# Patient Record
Sex: Female | Born: 1975
Health system: Southern US, Community
[De-identification: ages and names within clinical notes are randomized; demographics above are authoritative.]

## PROBLEM LIST (undated history)

## (undated) DIAGNOSIS — E119 Type 2 diabetes mellitus without complications: Secondary | ICD-10-CM

## (undated) DIAGNOSIS — E063 Autoimmune thyroiditis: Secondary | ICD-10-CM

## (undated) DIAGNOSIS — E079 Disorder of thyroid, unspecified: Secondary | ICD-10-CM

## (undated) DIAGNOSIS — D649 Anemia, unspecified: Secondary | ICD-10-CM

## (undated) HISTORY — DX: Autoimmune thyroiditis: E06.3

## (undated) HISTORY — DX: Disorder of thyroid, unspecified: E07.9

## (undated) HISTORY — DX: Anemia, unspecified: D64.9

---

## 2012-04-24 ENCOUNTER — Emergency Department: Payer: Self-pay | Admitting: Emergency Medicine

## 2012-04-24 LAB — COMPREHENSIVE METABOLIC PANEL
Anion Gap: 8 (ref 7–16)
Bilirubin,Total: 0.5 mg/dL (ref 0.2–1.0)
Chloride: 107 mmol/L (ref 98–107)
Co2: 26 mmol/L (ref 21–32)
Creatinine: 0.59 mg/dL — ABNORMAL LOW (ref 0.60–1.30)
EGFR (African American): >60
EGFR (Non-African Amer.): >60
Osmolality: 278 (ref 275–301)
SGPT (ALT): 17 U/L (ref 12–78)
Sodium: 141 mmol/L (ref 136–145)

## 2012-04-24 LAB — URINALYSIS, COMPLETE
Blood: NEGATIVE
Nitrite: NEGATIVE
Ph: 6 (ref 4.5–8.0)
Protein: NEGATIVE
RBC,UR: 2 /HPF (ref 0–5)
Specific Gravity: 1.011 (ref 1.003–1.030)

## 2012-04-24 LAB — CBC
HCT: 36.3 % (ref 35.0–47.0)
HGB: 12.2 g/dL (ref 12.0–16.0)
MCH: 27.9 pg (ref 26.0–34.0)
MCHC: 33.8 g/dL (ref 32.0–36.0)
MCV: 83 fL (ref 80–100)
RBC: 4.39 10*6/uL (ref 3.80–5.20)

## 2012-04-24 LAB — WET PREP, GENITAL

## 2012-04-24 LAB — LIPASE, BLOOD: Lipase: 146 U/L (ref 73–393)

## 2012-04-25 ENCOUNTER — Emergency Department: Payer: Self-pay | Admitting: Emergency Medicine

## 2012-04-25 LAB — COMPREHENSIVE METABOLIC PANEL
Albumin: 3.8 g/dL (ref 3.4–5.0)
Alkaline Phosphatase: 54 U/L (ref 50–136)
Anion Gap: 8 (ref 7–16)
BUN: 8 mg/dL (ref 7–18)
Bilirubin,Total: 0.4 mg/dL (ref 0.2–1.0)
Chloride: 104 mmol/L (ref 98–107)
Creatinine: 0.74 mg/dL (ref 0.60–1.30)
EGFR (African American): >60
Glucose: 80 mg/dL (ref 65–99)
SGOT(AST): 13 U/L — ABNORMAL LOW (ref 15–37)
SGPT (ALT): 18 U/L (ref 12–78)
Sodium: 140 mmol/L (ref 136–145)
Total Protein: 7.3 g/dL (ref 6.4–8.2)

## 2012-04-25 LAB — CBC
HCT: 37.2 % (ref 35.0–47.0)
HGB: 12.3 g/dL (ref 12.0–16.0)
MCH: 27.6 pg (ref 26.0–34.0)
Platelet: 208 10*3/uL (ref 150–440)
RDW: 13.9 % (ref 11.5–14.5)

## 2012-04-25 LAB — URINE CULTURE

## 2014-05-12 DIAGNOSIS — E063 Autoimmune thyroiditis: Secondary | ICD-10-CM | POA: Insufficient documentation

## 2014-08-23 LAB — OB RESULTS CONSOLE HEPATITIS B SURFACE ANTIGEN: HEP B S AG: NEGATIVE

## 2014-08-23 LAB — OB RESULTS CONSOLE HIV ANTIBODY (ROUTINE TESTING): HIV: NONREACTIVE

## 2014-08-23 LAB — OB RESULTS CONSOLE VARICELLA ZOSTER ANTIBODY, IGG: VARICELLA IGG: NON-IMMUNE/NOT IMMUNE

## 2014-08-23 LAB — OB RESULTS CONSOLE RUBELLA ANTIBODY, IGM: RUBELLA: NON-IMMUNE/NOT IMMUNE

## 2014-10-08 NOTE — Consult Note (Signed)
PATIENT NAME:  Yesenia Moore, Yesenia Moore MR#:  [id number] DATE OF BIRTH:  1975-10-04  DATE OF CONSULTATION:  04/24/2012  REFERRING PHYSICIAN:   CONSULTING PHYSICIAN:  Aradia Estey A. Marina Gravel, MD  REASON FOR CONSULTATION: Abdominal pain and possible appendicitis.   HISTORY: This is a 39 year old [place] female accompanied by her husband, who is fluent in [place], who presents to the Emergency Room with a now 24-hour history of rather severe right lower quadrant abdominal pain associated with mild nausea. The pain started with some discomfort yesterday afternoon, became acutely worse this morning. She has had no sick contacts. Her husband stated she had a fever approximately a week to 10 days ago thought to be viral nature. Imaging in the Emergency Room demonstrates a CT scan which I personally reviewed in both axial, sagittal, and coronal views on the PACS monitor. There is a right lower quadrant process which is suspicious for appendicitis. Transvaginal ultrasound demonstrates no evidence of tubo-ovarian abscess. The patient has had a previous Cesarean section in the [place] which she describes through her husband has being rather traumatic. Surgical services were asked to consult regarding question findings on CT scan.   ALLERGIES: None.   MEDICATIONS: None.   PAST MEDICAL HISTORY: hypothyrodism.  PAST SURGICAL HISTORY: Cesarean section through a lower midline incision approximately two years ago.   SOCIAL HISTORY: The patient is married, has one child.   PHYSICAL EXAMINATION:   GENERAL: She is alert and oriented. Affect is normal.   VITAL SIGNS: Temperature 98.7, pulse 88, respiratory rate 18, blood pressure 138/87, weight 125 pounds, BMI 22.1.   LUNGS: Clear.   HEART: Regular rate and rhythm.   ABDOMEN: Abdomen demonstrates a lower midline incision with no evidence of hernia. There is tenderness to deep palpation within the right lower quadrant. Negative Rovsing's sign.   EXTREMITIES: Warm and well  perfused.   RECTAL/GENITOURINARY: Deferred, however, pelvic examination performed by the Emergency Room physician was stated as normal.   LABORATORY VALUES: Lipase 146. Urine pregnancy test is negative. BUN 6, creatinine 0.59, sodium 141, potassium 3.6, chloride 107, alkaline phosphatase 47, ALT 17, AST 20, white count 7.6, hemoglobin 12.2, hematocrit 36.3, platelet count 210,000. Urinalysis is 3+ LE, 18 WBCs per field.   Review of CT scan is as described above. In addition, there is a density seen on the body of L3, likely hemangioma. MRI is recommended.   IMPRESSION: Abdominal pain. I am suspicious for appendicitis. I discussed with the patient and her husband extensively her options including observation with re-examination and possible appendectomy. She is very reluctant to be admitted to the hospital and even have surgery. She understands the risks of continued appendicitis and perforation with infection and a more complicated course. After much discussion, the patient elected not to be admitted with follow-up in either the Emergency Room or in our office within the next 12 to 24 hours. She will discharged on oral antibiotics.  ____________________________ Jeannette How Marina Gravel, MD mab:drc D: 04/24/2012 21:19:00 ET T: 04/25/2012 09:39:06 ET JOB#: [id number]  cc: Elta Guadeloupe A. Marina Gravel, MD, <Dictator> Glennis Borger A Rudy Domek MD ELECTRONICALLY SIGNED 04/25/2012 23:29

## 2015-01-21 ENCOUNTER — Encounter: Payer: BLUE CROSS/BLUE SHIELD | Attending: Obstetrics and Gynecology | Admitting: Dietician

## 2015-01-21 ENCOUNTER — Encounter: Payer: Self-pay | Admitting: Dietician

## 2015-01-21 VITALS — BP 122/72 | Ht 65.0 in | Wt 153.2 lb

## 2015-01-21 DIAGNOSIS — O24419 Gestational diabetes mellitus in pregnancy, unspecified control: Secondary | ICD-10-CM | POA: Diagnosis present

## 2015-01-21 DIAGNOSIS — O24410 Gestational diabetes mellitus in pregnancy, diet controlled: Secondary | ICD-10-CM

## 2015-01-21 NOTE — Patient Instructions (Signed)
Read booklet on Gestational Diabetes Follow Gestational Meal Planning Guidelines Complete a 3 Day Food Record and bring to next appointment Check blood sugars 4 x day - before breakfast and 2 hrs after every meal and record  Bring blood sugar log to next appointment Purchase urine ketone strips Check urine ketones every am:  If + increase bedtime snack to 1 protein and 2 carbohydrate servings Walk 20-30 minutes at least 5 x week if permitted by MD Next appointment    01-29-15 with dietitian Call if questions arise   [phone]

## 2015-01-21 NOTE — Progress Notes (Signed)
Diabetes Self-Management Education  Visit Type: First/Initial  Appt. Start Time: 1330 Appt. End Time: 5400  01/21/2015  Ms. Yesenia Moore, identified by name and date of birth, is a 39 y.o. female with a diagnosis of Diabetes: Gestational Diabetes.  Other people present during visit:  Spouse/SO   ASSESSMENT  Blood pressure 122/72, height 5\' 5"  (1.651 m), weight 153 lb 3.2 oz (69.491 kg), last menstrual period 06/28/2014. Body mass index is 25.49 kg/(m^2).  Initial Visit Information:  Lacks knowledge of diabetes care  Does on regular exercise               Psychosocial:     Patient Belief/Attitude about Diabetes: Motivated to manage diabetes Self-care barriers: English as a second language (pt is [place] but reads and speaks some English-husband helps interpret) Self-management support: Family Other persons present: Spouse/SO Patient Concerns: Glycemic Control, Healthy Lifestyle, Nutrition/Meal planning (prevent complications) Preferred Learning Style: Auditory, Visual, Hands on Learning Readiness: Ready  Complications:   How often do you check your blood sugar?:  2-3x/day past 1-2 wks Fasting Blood glucose range (mg/dL): 91-101 Postprandial Blood glucose range (mg/dL): 104-133 Have you had a dilated eye exam in the past 12 months?: No  Diet Intake:  Breakfast:  (eats 3 meals/day) Snack (morning):  (fruit or low sugar yogurt) Snack (afternoon):  (eats occasional afternoon snack-fruit or low sugar yogurt) Dinner:  (eats fried foods 4-5x/wk) Snack (evening):  (fruit or low sugar yogurt) Beverage(s):  (drinks mostly water and occasional sweetened coffee)  Exercise:  Exercise:  (occasionally does situps and stretching )  Individualized Plan for Diabetes Self-Management Training:   Learning Objective:  Patient will have a greater understanding of diabetes self-management.  Patient education plan per assessed needs and concerns is to attend individual sessions  for     Education Topics Reviewed with Patient Today:  Definition of diabetes, type 1 and 2, and the diagnosis of diabetes, Factors that contribute to the development of diabetes, Explored patient's options for treatment of their diabetes Role of diet in the treatment of diabetes and the relationship between the three main macronutrients and blood glucose level, Food label reading, portion sizes and measuring food., Carbohydrate counting  (importance of exercise for BG control)   Purpose and frequency of SMBG., Taught/discussed recording of test results and interpretation of SMBG., Ketone testing, when, how.  (symptoms of high BG's)   Role of stress on diabetes Pregnancy and GDM  Role of pre-pregnancy blood glucose control on the development of the fetus, Role of family planning for patients with diabetes, Reviewed with patient blood glucose goals with pregnancy Lifestyle issues that need to be addressed for better diabetes care, Helped patient develop diabetes management plan for (enter comment)  PATIENTS GOALS/Plan (Developed by the patient): Improve BG's  Lead healthier lifestyle Prevent complications Become more fit     Patient Instructions  Read booklet on Gestational Diabetes Follow Gestational Meal Planning Guidelines Complete a 3 Day Food Record and bring to next appointment Check blood sugars 4 x day - before breakfast and 2 hrs after every meal and record  Bring blood sugar log to next appointment Purchase urine ketone strips Check urine ketones every am:  If + increase bedtime snack to 1 protein and 2 carbohydrate servings Walk 20-30 minutes at least 5 x week if permitted by MD Next appointment    01-29-15 with dietitian Call if questions arise   [phone]   Expected Outcomes:   Good  Education material provided: General Meal Planning  Guidelines, GEM booklet  If problems or questions, patient to contact team via:  [phone]  Future DSME appointment:  01-29-15  with RD

## 2015-01-29 ENCOUNTER — Encounter: Payer: BLUE CROSS/BLUE SHIELD | Admitting: Dietician

## 2015-01-29 VITALS — BP 100/62 | Ht 65.0 in | Wt 153.0 lb

## 2015-01-29 DIAGNOSIS — O24410 Gestational diabetes mellitus in pregnancy, diet controlled: Secondary | ICD-10-CM

## 2015-01-29 DIAGNOSIS — O24419 Gestational diabetes mellitus in pregnancy, unspecified control: Secondary | ICD-10-CM | POA: Diagnosis not present

## 2015-01-29 NOTE — Progress Notes (Signed)
   Patient's BG record indicates BGs generally within goal ranges.  Patient's verbal diet history indicates typically balanced meals; small supper likely low in protein, advised pt to add nuts to yogurt to boost protein.   Provided 1900kcal meal plan, and wrote individualized menus based on patient's food preferences.  Instructed patient on food safety, including avoidance of Listeriosis, and limiting mercury from fish.  Discussed importance of maintaining healthy lifestyle habits to reduce risk of Type 2 DM as well as Gestational DM with any future pregnancies.  Advised patient to use any remaining testing supplies to test some BGs after delivery, and to have BG tested ideally annually, as well as prior to attempting future pregnancies.

## 2015-03-04 LAB — OB RESULTS CONSOLE GC/CHLAMYDIA: Chlamydia: NEGATIVE

## 2015-03-04 LAB — OB RESULTS CONSOLE GBS: GBS: NEGATIVE

## 2015-03-26 ENCOUNTER — Encounter
Admission: RE | Admit: 2015-03-26 | Discharge: 2015-03-26 | Disposition: A | Discharge status: Home / Self Care | Payer: BLUE CROSS/BLUE SHIELD | Source: Ambulatory Visit | Attending: Obstetrics and Gynecology | Admitting: Obstetrics and Gynecology

## 2015-03-26 HISTORY — DX: Type 2 diabetes mellitus without complications: E11.9

## 2015-03-26 LAB — DIFFERENTIAL
BASOS PCT: 0 %
Basophils Absolute: 0 10*3/uL (ref 0–0.1)
EOS ABS: 0 10*3/uL (ref 0–0.7)
EOS PCT: 1 %
LYMPHS ABS: 1.6 10*3/uL (ref 1.0–3.6)
Lymphocytes Relative: 21 %
MONOS PCT: 12 %
Monocytes Absolute: 0.9 10*3/uL (ref 0.2–0.9)
NEUTROS PCT: 66 %
Neutro Abs: 4.9 10*3/uL (ref 1.4–6.5)

## 2015-03-26 LAB — CBC
HEMATOCRIT: 36.4 % (ref 35.0–47.0)
HEMOGLOBIN: 11.9 g/dL — AB (ref 12.0–16.0)
MCH: 27.4 pg (ref 26.0–34.0)
MCHC: 32.7 g/dL (ref 32.0–36.0)
MCV: 83.8 fL (ref 80.0–100.0)
Platelets: 124 10*3/uL — ABNORMAL LOW (ref 150–440)
RBC: 4.34 MIL/uL (ref 3.80–5.20)
RDW: 17.8 % — ABNORMAL HIGH (ref 11.5–14.5)
WBC: 7.7 10*3/uL (ref 3.6–11.0)

## 2015-03-26 LAB — TYPE AND SCREEN
ABO/RH(D): O POS
Antibody Screen: NEGATIVE

## 2015-03-26 LAB — RAPID HIV SCREEN (HIV 1/2 AB+AG)
HIV 1/2 Antibodies: NONREACTIVE
HIV-1 P24 Antigen - HIV24: NONREACTIVE

## 2015-03-26 LAB — ABO/RH: ABO/RH(D): O POS

## 2015-03-26 NOTE — Progress Notes (Signed)
Yesenia Moore   Your procedure is scheduled on: date 03/27/2015            Yesenia Moore   Your procedure is scheduled on: date 03/27/2015   Report to [facility] at 06:00 am.             [facility]             [place], [place] [address]  Call this number if you have problems the morning of surgery: [phone]   Remember:   Do not eat food or drink liquids after midnight.     Do not wear jewelry, make-up or nail polish.  Do not wear lotions, powders, or perfumes. You may wear deodorant.  Do not shave prior to surgery.  Do not bring valuables to the hospital.  [facility] is not responsible for any            belongings or valuables.                Contacts, dentures or bridgework may not be worn into surgery.  Leave suitcase in the car. After surgery it may be brought to your room.  For patients admitted to the hospital, discharge time is determined by your             treatment team.               Special Instructions: Preparing the skin before Cesarean Section              To help prevent the risk of infection at your surgical site, we are providing             you with rinse-free Sage 2% Chlorhexidine Gluconate (HCG) disposable             wipes.               The night before surgery:              1. Shower or bathe with warm water             2. Do not apply lotion or perfume             3. Wait one hour after shower, skin should be dry and cool             4. Open Sage wipe package - 2 disposable cloths are inside             5. Wipe the lower abdomen from the pubic line to the navel and hip bone to hip             bone with one cloth             6. Use the second cloth to wipe the front of the upper thighs             7. Allow the area to dry for one minute. DO NOT RINSE             8. Skin may feel "tacky" for several minutes             9. Dress in freshly laundered, clean clothes           10. Do not  shower the morning of surgery    Please read over the  following fact sheets that you were given: Coughing and            Deep Breathing and Surgical Site Infection Prevention    Report to [facility] at 0600 am.             [facility]             [place], [place] [address]  Call this number if you have problems the morning of surgery: [phone]   Remember:   Do not eat food or drink liquids after midnight.     Do not wear jewelry, make-up or nail polish.  Do not wear lotions, powders, or perfumes. You may wear deodorant.  Do not shave prior to surgery.  Do not bring valuables to the hospital.  [facility] is not responsible for any            belongings or valuables.                Contacts, dentures or bridgework may not be worn into surgery.  Leave suitcase in the car. After surgery it may be brought to your room.  For patients admitted to the hospital, discharge time is determined by your             treatment team.               Special Instructions: Preparing the skin before Cesarean Section              To help prevent the risk of infection at your surgical site, we are providing             you with rinse-free Sage 2% Chlorhexidine Gluconate (HCG) disposable             wipes.               The night before surgery:              1. Shower or bathe with warm water             2. Do not apply lotion or perfume             3. Wait one hour after shower, skin should be dry and cool             4. Open Sage wipe package - 2 disposable cloths are inside             5. Wipe the lower abdomen from the pubic line to the navel and hip bone to hip             bone with one cloth             6. Use the second cloth to wipe the front of the upper thighs             7. Allow the area to dry for one minute. DO NOT RINSE             8. Skin may feel "tacky" for several minutes             9. Dress in freshly laundered, clean  clothes           10. Do not shower the morning of surgery    Please read over the following fact sheets that you were given: Coughing and  Deep Breathing and Surgical Site Infection Prevention

## 2015-03-27 ENCOUNTER — Inpatient Hospital Stay: Payer: BLUE CROSS/BLUE SHIELD | Admitting: Certified Registered"

## 2015-03-27 ENCOUNTER — Encounter
Admission: RE | Disposition: A | Discharge status: Home / Self Care | Payer: Self-pay | Source: Ambulatory Visit | Attending: Obstetrics and Gynecology

## 2015-03-27 ENCOUNTER — Inpatient Hospital Stay
Admission: RE | Admit: 2015-03-27 | Discharge: 2015-03-30 | DRG: 765 | Disposition: A | Discharge status: Home / Self Care | Payer: BLUE CROSS/BLUE SHIELD | Source: Ambulatory Visit | Attending: Obstetrics and Gynecology | Admitting: Obstetrics and Gynecology

## 2015-03-27 ENCOUNTER — Encounter: Payer: Self-pay | Admitting: *Deleted

## 2015-03-27 DIAGNOSIS — Z98891 History of uterine scar from previous surgery: Secondary | ICD-10-CM

## 2015-03-27 DIAGNOSIS — O34211 Maternal care for low transverse scar from previous cesarean delivery: Principal | ICD-10-CM | POA: Diagnosis present

## 2015-03-27 DIAGNOSIS — Z3A39 39 weeks gestation of pregnancy: Secondary | ICD-10-CM | POA: Diagnosis not present

## 2015-03-27 DIAGNOSIS — O24410 Gestational diabetes mellitus in pregnancy, diet controlled: Secondary | ICD-10-CM | POA: Diagnosis present

## 2015-03-27 DIAGNOSIS — O99284 Endocrine, nutritional and metabolic diseases complicating childbirth: Secondary | ICD-10-CM | POA: Diagnosis present

## 2015-03-27 DIAGNOSIS — E039 Hypothyroidism, unspecified: Secondary | ICD-10-CM | POA: Diagnosis present

## 2015-03-27 DIAGNOSIS — D62 Acute posthemorrhagic anemia: Secondary | ICD-10-CM | POA: Diagnosis present

## 2015-03-27 LAB — RPR: RPR Ser Ql: NONREACTIVE

## 2015-03-27 LAB — GLUCOSE, CAPILLARY: Glucose-Capillary: 87 mg/dL (ref 65–99)

## 2015-03-27 SURGERY — Surgical Case
Anesthesia: Regional

## 2015-03-27 MED ORDER — OXYTOCIN 40 UNITS IN LACTATED RINGERS INFUSION - SIMPLE MED
62.5000 mL/h | INTRAVENOUS | Status: AC
  Filled 2015-03-27: qty 1000

## 2015-03-27 MED ORDER — LACTATED RINGERS IV SOLN
INTRAVENOUS | Status: DC
  Administered 2015-03-28: 03:00:00 via INTRAVENOUS

## 2015-03-27 MED ORDER — ONDANSETRON HCL 4 MG/2ML IJ SOLN
4.0000 mg | Freq: Three times a day (TID) | INTRAMUSCULAR | Status: DC | PRN
  Administered 2015-03-27: 4 mg via INTRAVENOUS
  Filled 2015-03-27: qty 2

## 2015-03-27 MED ORDER — FERROUS SULFATE 325 (65 FE) MG PO TABS
325.0000 mg | ORAL_TABLET | Freq: Two times a day (BID) | ORAL | Status: DC
  Administered 2015-03-27 – 2015-03-29 (×5): 325 mg via ORAL
  Filled 2015-03-27 (×7): qty 1

## 2015-03-27 MED ORDER — LANOLIN HYDROUS EX OINT: 1.0000 "application " | TOPICAL_OINTMENT | CUTANEOUS | Status: DC | PRN

## 2015-03-27 MED ORDER — NALBUPHINE HCL 10 MG/ML IJ SOLN
5.0000 mg | Freq: Once | INTRAMUSCULAR | Status: DC | PRN
  Filled 2015-03-27: qty 0.5

## 2015-03-27 MED ORDER — PROMETHAZINE HCL 25 MG/ML IJ SOLN: 25.0000 mg | Freq: Four times a day (QID) | INTRAMUSCULAR | Status: DC | PRN

## 2015-03-27 MED ORDER — MORPHINE SULFATE (PF) 0.5 MG/ML IJ SOLN
INTRAMUSCULAR | Status: DC | PRN
  Administered 2015-03-27: .2 mg via INTRATHECAL

## 2015-03-27 MED ORDER — IBUPROFEN 600 MG PO TABS
600.0000 mg | ORAL_TABLET | Freq: Four times a day (QID) | ORAL | Status: DC | PRN
  Administered 2015-03-28 – 2015-03-30 (×5): 600 mg via ORAL
  Filled 2015-03-27 (×6): qty 1

## 2015-03-27 MED ORDER — SENNOSIDES-DOCUSATE SODIUM 8.6-50 MG PO TABS
2.0000 | ORAL_TABLET | ORAL | Status: DC
  Administered 2015-03-29: 2 via ORAL
  Filled 2015-03-27 (×2): qty 2

## 2015-03-27 MED ORDER — OXYTOCIN 40 UNITS IN LACTATED RINGERS INFUSION - SIMPLE MED
INTRAVENOUS | Status: AC
  Filled 2015-03-27: qty 1000

## 2015-03-27 MED ORDER — SCOPOLAMINE 1 MG/3DAYS TD PT72
1.0000 | MEDICATED_PATCH | Freq: Once | TRANSDERMAL | Status: AC
  Administered 2015-03-27: 1.5 mg via TRANSDERMAL
  Filled 2015-03-27: qty 1

## 2015-03-27 MED ORDER — WITCH HAZEL-GLYCERIN EX PADS: 1.0000 "application " | MEDICATED_PAD | CUTANEOUS | Status: DC | PRN

## 2015-03-27 MED ORDER — CEFAZOLIN SODIUM-DEXTROSE 2-3 GM-% IV SOLR
2.0000 g | INTRAVENOUS | Status: AC
  Administered 2015-03-27: 2 g via INTRAVENOUS
  Filled 2015-03-27: qty 50

## 2015-03-27 MED ORDER — DIBUCAINE 1 % RE OINT: 1.0000 "application " | TOPICAL_OINTMENT | RECTAL | Status: DC | PRN

## 2015-03-27 MED ORDER — OXYTOCIN 40 UNITS IN LACTATED RINGERS INFUSION - SIMPLE MED
INTRAVENOUS | Status: DC | PRN
  Administered 2015-03-27: 1 mL via INTRAVENOUS
  Administered 2015-03-27: 799 mL via INTRAVENOUS

## 2015-03-27 MED ORDER — FENTANYL CITRATE (PF) 100 MCG/2ML IJ SOLN: 25.0000 ug | INTRAMUSCULAR | Status: DC | PRN

## 2015-03-27 MED ORDER — BUPIVACAINE HCL (PF) 0.5 % IJ SOLN
INTRAMUSCULAR | Status: AC
  Filled 2015-03-27: qty 30

## 2015-03-27 MED ORDER — LACTATED RINGERS IV SOLN
INTRAVENOUS | Status: DC
  Administered 2015-03-27 (×2): via INTRAVENOUS

## 2015-03-27 MED ORDER — ONDANSETRON HCL 4 MG/2ML IJ SOLN: 4.0000 mg | Freq: Once | INTRAMUSCULAR | Status: DC | PRN

## 2015-03-27 MED ORDER — BUPIVACAINE HCL 0.5 % IJ SOLN
INTRAMUSCULAR | Status: DC | PRN
  Administered 2015-03-27: 10 mL

## 2015-03-27 MED ORDER — HYDROMORPHONE HCL 1 MG/ML IJ SOLN: 0.2500 mg | INTRAMUSCULAR | Status: DC | PRN

## 2015-03-27 MED ORDER — BUPIVACAINE ON-Q PAIN PUMP (FOR ORDER SET NO CHG)
INJECTION | Status: DC
  Filled 2015-03-27: qty 1

## 2015-03-27 MED ORDER — BUPIVACAINE 0.25 % ON-Q PUMP DUAL CATH 400 ML
INJECTION | Status: AC
  Filled 2015-03-27: qty 400

## 2015-03-27 MED ORDER — ONDANSETRON HCL 4 MG/2ML IJ SOLN
INTRAMUSCULAR | Status: DC | PRN
  Administered 2015-03-27: 4 mg via INTRAVENOUS

## 2015-03-27 MED ORDER — CITRIC ACID-SODIUM CITRATE 334-500 MG/5ML PO SOLN
ORAL | Status: AC
  Administered 2015-03-27: 30 mL
  Filled 2015-03-27: qty 15

## 2015-03-27 MED ORDER — MEPERIDINE HCL 25 MG/ML IJ SOLN: 6.2500 mg | INTRAMUSCULAR | Status: DC | PRN

## 2015-03-27 MED ORDER — FENTANYL CITRATE (PF) 100 MCG/2ML IJ SOLN
INTRAMUSCULAR | Status: DC | PRN
  Administered 2015-03-27: 20 ug via INTRATHECAL

## 2015-03-27 MED ORDER — DIPHENHYDRAMINE HCL 25 MG PO CAPS: 25.0000 mg | ORAL_CAPSULE | ORAL | Status: DC | PRN

## 2015-03-27 MED ORDER — VARICELLA VIRUS VACCINE LIVE 1350 PFU/0.5ML IJ SUSR: 0.5000 mL | INTRAMUSCULAR | Status: DC | PRN

## 2015-03-27 MED ORDER — BUPIVACAINE HCL (PF) 0.5 % IJ SOLN: 10.0000 mL | Freq: Once | INTRAMUSCULAR | Status: DC

## 2015-03-27 MED ORDER — EPHEDRINE SULFATE 50 MG/ML IJ SOLN
INTRAMUSCULAR | Status: DC | PRN
  Administered 2015-03-27: 5 mg via INTRAVENOUS
  Administered 2015-03-27: 10 mg via INTRAVENOUS
  Administered 2015-03-27: 5 mg via INTRAVENOUS

## 2015-03-27 MED ORDER — LEVOTHYROXINE SODIUM 50 MCG PO TABS
25.0000 ug | ORAL_TABLET | Freq: Every day | ORAL | Status: DC
Start: 2015-03-28 — End: 2015-03-30
  Administered 2015-03-28 – 2015-03-30 (×3): 25 ug via ORAL
  Filled 2015-03-27 (×3): qty 1

## 2015-03-27 MED ORDER — MEASLES, MUMPS & RUBELLA VAC ~~LOC~~ INJ: 0.5000 mL | INJECTION | SUBCUTANEOUS | Status: DC | PRN

## 2015-03-27 MED ORDER — PRENATAL MULTIVITAMIN CH
1.0000 | ORAL_TABLET | Freq: Every day | ORAL | Status: DC
  Administered 2015-03-28 – 2015-03-29 (×2): 1 via ORAL
  Filled 2015-03-27 (×3): qty 1

## 2015-03-27 MED ORDER — DIPHENHYDRAMINE HCL 25 MG PO CAPS: 25.0000 mg | ORAL_CAPSULE | Freq: Four times a day (QID) | ORAL | Status: DC | PRN

## 2015-03-27 MED ORDER — DEXTROSE 5 % IV SOLN
1.0000 ug/kg/h | INTRAVENOUS | Status: DC | PRN
  Filled 2015-03-27: qty 2

## 2015-03-27 MED ORDER — LACTATED RINGERS IV SOLN
Freq: Once | INTRAVENOUS | Status: AC
  Administered 2015-03-27: 500 mL/h via INTRAVENOUS

## 2015-03-27 MED ORDER — SIMETHICONE 80 MG PO CHEW
80.0000 mg | CHEWABLE_TABLET | Freq: Three times a day (TID) | ORAL | Status: DC
  Administered 2015-03-27 – 2015-03-29 (×6): 80 mg via ORAL
  Filled 2015-03-27 (×6): qty 1

## 2015-03-27 MED ORDER — PHENYLEPHRINE HCL 10 MG/ML IJ SOLN
INTRAMUSCULAR | Status: DC | PRN
  Administered 2015-03-27 (×9): 100 ug via INTRAVENOUS

## 2015-03-27 MED ORDER — BUPIVACAINE IN DEXTROSE 0.75-8.25 % IT SOLN
INTRATHECAL | Status: DC | PRN
  Administered 2015-03-27: 1.6 mL via INTRATHECAL

## 2015-03-27 MED ORDER — NALOXONE HCL 0.4 MG/ML IJ SOLN: 0.4000 mg | INTRAMUSCULAR | Status: DC | PRN

## 2015-03-27 MED ORDER — SODIUM CHLORIDE 0.9 % IJ SOLN: 3.0000 mL | INTRAMUSCULAR | Status: DC | PRN

## 2015-03-27 MED ORDER — DIPHENHYDRAMINE HCL 50 MG/ML IJ SOLN: 12.5000 mg | INTRAMUSCULAR | Status: DC | PRN

## 2015-03-27 MED ORDER — BUPIVACAINE HCL 0.5 % IJ SOLN
5.0000 mL | Freq: Once | INTRAMUSCULAR | Status: DC
  Filled 2015-03-27: qty 5

## 2015-03-27 MED ORDER — MENTHOL 3 MG MT LOZG: 1.0000 | LOZENGE | OROMUCOSAL | Status: DC | PRN

## 2015-03-27 MED ORDER — CHLORHEXIDINE GLUCONATE CLOTH 2 % EX PADS
6.0000 | MEDICATED_PAD | Freq: Every day | CUTANEOUS | Status: DC
  Administered 2015-03-27: 2 via TOPICAL

## 2015-03-27 MED ORDER — BUPIVACAINE 0.25 % ON-Q PUMP DUAL CATH 400 ML: 400.0000 mL | INJECTION | Status: DC

## 2015-03-27 MED ORDER — NALBUPHINE HCL 10 MG/ML IJ SOLN
5.0000 mg | INTRAMUSCULAR | Status: DC | PRN
  Filled 2015-03-27: qty 0.5

## 2015-03-27 SURGICAL SUPPLY — 28 items
CANISTER SUCT 3000ML (MISCELLANEOUS) ×2 IMPLANT
CATH KIT ON-Q SILVERSOAK 5IN (CATHETERS) ×4 IMPLANT
DRSG TELFA 3X8 NADH (GAUZE/BANDAGES/DRESSINGS) ×2 IMPLANT
ELECT CAUTERY BLADE 6.4 (BLADE) ×2 IMPLANT
GAUZE SPONGE 4X4 12PLY STRL (GAUZE/BANDAGES/DRESSINGS) ×2 IMPLANT
GLOVE BIO SURGEON STRL SZ7 (GLOVE) ×16 IMPLANT
GLOVE INDICATOR 7.5 STRL GRN (GLOVE) ×2 IMPLANT
GOWN STRL REUS W/ TWL LRG LVL3 (GOWN DISPOSABLE) ×4 IMPLANT
GOWN STRL REUS W/TWL LRG LVL3 (GOWN DISPOSABLE) ×4
LIQUID BAND (GAUZE/BANDAGES/DRESSINGS) ×2 IMPLANT
NS IRRIG 1000ML POUR BTL (IV SOLUTION) ×2 IMPLANT
PACK C SECTION AR (MISCELLANEOUS) ×2 IMPLANT
PAD GROUND ADULT SPLIT (MISCELLANEOUS) ×2 IMPLANT
PAD OB MATERNITY 4.3X12.25 (PERSONAL CARE ITEMS) ×2 IMPLANT
PAD PREP 24X41 OB/GYN DISP (PERSONAL CARE ITEMS) ×2 IMPLANT
SPONGE LAP 18X18 5 PK (GAUZE/BANDAGES/DRESSINGS) ×4 IMPLANT
STRIP CLOSURE SKIN 1/2X4 (GAUZE/BANDAGES/DRESSINGS) ×2 IMPLANT
SUT CHROMIC GUT BROWN 0 54 (SUTURE) ×1 IMPLANT
SUT CHROMIC GUT BROWN 0 54IN (SUTURE) ×2
SUT MNCRL 4-0 (SUTURE) ×1
SUT MNCRL 4-0 27XMFL (SUTURE) ×1
SUT PDS AB 1 TP1 96 (SUTURE) ×2 IMPLANT
SUT PLAIN 2 0 XLH (SUTURE) ×2 IMPLANT
SUT VIC AB 0 CT1 36 (SUTURE) ×8 IMPLANT
SUT VIC AB 3-0 SH 27 (SUTURE) ×3
SUT VIC AB 3-0 SH 27X BRD (SUTURE) ×3 IMPLANT
SUTURE MNCRL 4-0 27XMF (SUTURE) ×1 IMPLANT
SWABSTK COMLB BENZOIN TINCTURE (MISCELLANEOUS) ×2 IMPLANT

## 2015-03-27 NOTE — Anesthesia Procedure Notes (Signed)
Spinal Patient location during procedure: OR Staffing Performed by: resident/CRNA  Preanesthetic Checklist Completed: patient identified, site marked, surgical consent, pre-op evaluation, timeout performed, IV checked, risks and benefits discussed and monitors and equipment checked Spinal Block Patient position: sitting Prep: Betadine Patient monitoring: heart rate, continuous pulse ox, blood pressure and cardiac monitor Approach: midline Location: L4-5 Injection technique: single-shot Needle Needle type: Whitacre and Introducer  Needle gauge: 25 G Needle length: 9 cm Assessment Events: paresthesia Additional Notes Transient left side  Paresthesia that resolved spontaneously. Negative blood return. Positive free-flowing CSF. Expiration date of kit checked and confirmed. Patient tolerated procedure well, without complications.

## 2015-03-27 NOTE — Transfer of Care (Signed)
Immediate Anesthesia Transfer of Care Note  Patient: Yesenia Moore  Procedure(s) Performed: Procedure(s): CESAREAN SECTION (N/A)  Patient Location: Mother/Baby  Anesthesia Type:Spinal  Level of Consciousness: awake  Airway & Oxygen Therapy: Patient Spontanous Breathing  Post-op Assessment: Report given to RN  Post vital signs: Reviewed  Last Vitals:  Filed Vitals:   03/27/15 1009  BP: 108/73  Pulse: 57  Temp: 36.3 C  Resp: 16    Complications: No apparent anesthesia complications

## 2015-03-27 NOTE — Op Note (Signed)
Cesarean Section Procedure Note   Yesenia Moore   03/27/2015   Pre-operative Diagnosis: 1) Interauterine pregnancy at 39 weeks, 2) PRIOR C-SECTION, desires repeat  Post-operative Diagnosis: 1) Interauterine pregnancy at 39 weeks, 2) PRIOR C-SECTION, desires repeat  Procedure: repeat low transverse cesarean section via vertical skin incision  Surgeon: Surgeon(s) and Role:    * Will Bonnet, MD - Primary    * Malachy Mood, MD - Assisting   Anesthesia: spinal   Findings:  normal appearing gravid uterus (small anterior fibroid, subserosal), fallopian tubes, and ovaries    Estimated Blood Loss: 750 mL  Total IV Fluids: 1,400 ml   Urine Output: 100 mL clear urine at end of procedure  Specimens: None  Complications: no complications  Disposition: PACU - hemodynamically stable.   Maternal Condition: stable   Baby condition / location:  Couplet care / Skin to Skin  Procedure Details:  The patient was seen in the Holding Room. The risks, benefits, complications, treatment options, and expected outcomes were discussed with the patient. The patient concurred with the proposed plan, giving informed consent. identified as Yesenia Moore and the procedure verified as C-Section Delivery. A Time Out was held and the above information confirmed.   After induction of anesthesia, the patient was draped and prepped in the usual sterile manner. A midline vertical skin incision was made and carried down through the subcutaneous tissue to the fascia. Fascial incision was made and extended in the cephalo-caudad direction. The fascia was separated from the underlying rectus tissue. The peritoneum was identified and entered. Peritoneal incision was extended longitudinally. The bladder flap was bluntly freed from the lower uterine segment. There were several filmy adhesions taken down with electrocautery.  A low transverse uterine incision was made and the hysterotomy was extended with  cranial-caudal tension. Delivered from cephalic presentation was a 3,850 gram Living newborn infant(s) with Apgar scores of 8 at one minute and 9 at five minutes. Cord ph was not sent the umbilical cord was clamped and cut cord blood was obtained for evaluation. The placenta was removed Intact and appeared normal. The uterine outline, tubes and ovaries appeared normal apart from a small (~2cm) subserosal anterior fibroid).   The uterine incision was closed with running locked sutures of 0 Vicryl.  A second layer of the same suture was thrown in an imbricating fashion.  Hemostasis was assured.  The uterus was retained to the abdomen and the paracolic gutters were cleared of all clots and debris.  The rectus muscles were inspected and found to be hemostatic.  The peritoneum was closed in a running fashion using 0 vicryl.    The On-Q catheter pumps were inserted in accordance with the manufacturer's recommendations.  The catheters were inserted approximately 3cm lateral to the incision line with care to avoid the inferior epigastric vessels.  They were inserted to a depth of the 3rd mark. They were positioned superficial to the rectus abdominus muscles and deep to the rectus fascia.    The fascia was then reapproximated with running sutures of 1-0 PDS, looped. Approximately 6-7 subcutaneous, interrupted stitches were thrown to closed dead space and decrease tension on the skin closure. The subcuticular closure was performed using 4-0 monocryl. The skin closure was reinforced using benzoin and 1/2" steri-strips.  The On-Q catheters were bolused with 5 mL of 0.5% marcaine plain for a total of 10 mL.  The catheters were affixed to the skin with surgical skin glue, steri-strips, and tegaderm.    Instrument,  sponge, and needle counts were correct prior the abdominal closure and were correct at the conclusion of the case.  The patient received Ancef 2 gram IV prior to skin incision (within 30 minutes). For VTE  prophylaxis she was wearing SCDs throughout the case.   Signed: Will Bonnet, MD, [place] 03/27/2015 10:05 AM

## 2015-03-27 NOTE — H&P (Signed)
History and Physical Interval Note:  Yesenia Moore  has presented today for surgery, with the diagnosis of PRIOR C-SECTION  The various methods of treatment have been discussed with the patient and family. After consideration of risks, benefits and other options for treatment, the patient has consented to  Procedure(s): CESAREAN SECTION (N/A) as a surgical intervention .  The patient's history has been reviewed, patient examined, no change in status, stable for surgery.  I have reviewed the patient's chart and labs.  Questions were answered to the patient's satisfaction.    The patient does not take a beta blocker and one is not indicated for this surgery.   Will Bonnet, MD 03/27/2015 8:03 AM

## 2015-03-27 NOTE — Progress Notes (Signed)
Pt. Ready for transfer to OR for repeat C/S.  Spouse at the bedside, pt. Chooses spouse to interpret for her, pt. Understands English and speaks english but request her husband to translate -if needed. Dr. Glennon Mac and Dr. Andree Elk to the bedside to greet pt., and answer any questions pt. May have pertaining to the C/S.  Pt. Verbalized understanding of procedure, spouse asked questions, questions answered.  RN assist pt. To OR (ambulate) for C/S. OR staff ready and waiting in OR.

## 2015-03-27 NOTE — Anesthesia Preprocedure Evaluation (Addendum)
Anesthesia Evaluation  Patient identified by MRN, date of birth, ID band Patient awake    Reviewed: Allergy & Precautions, H&P , NPO status , Patient's Chart, lab work & pertinent test results, reviewed documented beta blocker date and time   Airway Mallampati: III  TM Distance: >3 FB Neck ROM: full    Dental no notable dental hx. (+) Teeth Intact   Pulmonary neg pulmonary ROS,    Pulmonary exam normal breath sounds clear to auscultation       Cardiovascular Exercise Tolerance: Good negative cardio ROS Normal cardiovascular exam Rhythm:regular Rate:Normal     Neuro/Psych negative neurological ROS  negative psych ROS   GI/Hepatic negative GI ROS, Neg liver ROS,   Endo/Other  negative endocrine ROSdiabetesHypothyroidism Hx of hashimotos thyroiditis.  Renal/GU negative Renal ROS  negative genitourinary   Musculoskeletal   Abdominal   Peds  Hematology negative hematology ROS (+) anemia ,   Anesthesia Other Findings Hx of tachycardia after a dental procedure in the [place].  Pt is unaware of any anaphylactic sequelae and had elevated heart rate when given ?"octrataine"...per orders husbands recollection.  No known problems with lidocaine or bupivicaine.  I have gone over the risks and benefits of spinal vs. General anesthesia and based on our thourough discussion, they prefer spinal.  JA  Reproductive/Obstetrics (+) Pregnancy                            Anesthesia Physical Anesthesia Plan  ASA: II  Anesthesia Plan: Regional and Spinal   Post-op Pain Management:    Induction:   Airway Management Planned:   Additional Equipment:   Intra-op Plan:   Post-operative Plan:   Informed Consent: I have reviewed the patients History and Physical, chart, labs and discussed the procedure including the risks, benefits and alternatives for the proposed anesthesia with the patient or authorized  representative who has indicated his/her understanding and acceptance.     Plan Discussed with: CRNA  Anesthesia Plan Comments:         Anesthesia Quick Evaluation

## 2015-03-27 NOTE — Progress Notes (Signed)
Pericare given, clean pad applied, pt. Transferred to [place] [facility]. Spouse in the nursery with infant.  Pain 0/10 with OnQ in place, foley with 0 ml (150 ml emptied). Pt. Stable.

## 2015-03-27 NOTE — Discharge Summary (Signed)
Obstetrical Discharge Summary  Date of Admission: 03/27/2015 Date of Discharge: 03/30/2015  Primary OB: [facility]  Gestational Age at Delivery: [redacted]w[redacted]d  Antepartum complications: 1) Hypothyroidism, 2) history of prior cesarean delivery with unknown uterine incision type, 3) gestational diabetes, diet controlled Reason for Admission: Scheduled repeat cesarean delivery Date of Delivery:  03/27/2015  Delivered By: Prentice Docker, MD Delivery Type: repeat cesarean section, low transverse incision Intrapartum complications/course: None Anesthesia: Spinal Placenta: sponatneous Laceration: n/a Episiotomy: none Newborn Data: Live born female  Birth Weight: 8 lb 7.8 oz (3850 g) APGAR: 8, 9  Discharge Physical Exam:  BP 112/65 mmHg  Pulse 67  Temp(Src) 98.5 F (36.9 C) (Oral)  Resp 18  Ht 5\' 5"  (1.651 m)  Wt 70.308 kg (155 lb)  BMI 25.79 kg/m2  SpO2 100%  Breastfeeding? Unknown  General: NAD CV: RRR Pulm: CTABL, nl effort ABD: s/nd/nt, fundus firm and below the umbilicus Lochia: moderate Incision: c/d/i DVT Evaluation: LE non-ttp, no evidence of DVT on exam.  HEMOGLOBIN  Date Value Ref Range Status  03/28/2015 10.0* 12.0 - 16.0 g/dL Final   HGB  Date Value Ref Range Status  04/25/2012 12.3 12.0-16.0 g/dL Final   HCT  Date Value Ref Range Status  03/28/2015 29.5* 35.0 - 47.0 % Final  04/25/2012 37.2 35.0-47.0 % Final    Post partum course: Uncomplicated postpartum course Postpartum Procedures: none Disposition: stable, discharge to home.  Rh Immune globulin given: n/a Rubella vaccine given: yes Tdap vaccine given in AP or PP setting: yes on 02/18/15 Flu vaccine given in AP or PP setting: no  Contraception: TBD  Prenatal Labs: O+ / RNI / VZNI / HBsAg neg / RPR NR / HIV neg / Pap HPV pos, no dysplasia     Plan:  Yesenia Moore was discharged to home in good condition. Follow-up appointment at [facility] with Dr Glennon Mac in 1 week for incision check. She  will also need a 2 hour glucose tolerance test scheduled for her six week visit.   Discharge Medications:   Medication List    TAKE these medications        ferrous sulfate 325 (65 FE) MG tablet  Take 1 tablet (325 mg total) by mouth daily.     ibuprofen 600 MG tablet  Commonly known as:  ADVIL,MOTRIN  Take 1 tablet (600 mg total) by mouth every 6 (six) hours as needed for mild pain.     oxyCODONE-acetaminophen 5-325 MG tablet  Commonly known as:  PERCOCET/ROXICET  Take 1-2 tablets by mouth every 3 (three) hours as needed for moderate pain.     PRENATAL VITAMINS PO  Take 1 tablet by mouth daily.     SYNTHROID 25 MCG tablet  Generic drug:  levothyroxine  Take 1 tablet by mouth daily.        Signed: Malachy Mood, MD

## 2015-03-28 LAB — CBC
HCT: 29.5 % — ABNORMAL LOW (ref 35.0–47.0)
HEMOGLOBIN: 10 g/dL — AB (ref 12.0–16.0)
MCH: 28.1 pg (ref 26.0–34.0)
MCHC: 33.8 g/dL (ref 32.0–36.0)
MCV: 83.2 fL (ref 80.0–100.0)
PLATELETS: 116 10*3/uL — AB (ref 150–440)
RBC: 3.55 MIL/uL — AB (ref 3.80–5.20)
RDW: 18.1 % — ABNORMAL HIGH (ref 11.5–14.5)
WBC: 10.5 10*3/uL (ref 3.6–11.0)

## 2015-03-28 MED ORDER — MORPHINE SULFATE (PF) 2 MG/ML IV SOLN: 1.0000 mg | INTRAVENOUS | Status: DC | PRN

## 2015-03-28 MED ORDER — OXYCODONE-ACETAMINOPHEN 5-325 MG PO TABS
1.0000 | ORAL_TABLET | ORAL | Status: DC | PRN
  Filled 2015-03-28: qty 2

## 2015-03-28 NOTE — Anesthesia Post-op Follow-up Note (Signed)
  Anesthesia Pain Follow-up Note  Patient: Yesenia Moore  Day #: 1  Date of Follow-up: 03/28/2015 Time: 7:08 AM  Last Vitals:  Filed Vitals:   03/28/15 0255  BP: 97/56  Pulse: 71  Temp: 36.6 C  Resp: 16    Level of Consciousness: alert  Pain: none   Side Effects:None  Catheter Site Exam:clean, dry, no drainage  Plan: D/C from anesthesia care  COOK-MARTIN,CINDY

## 2015-03-28 NOTE — Progress Notes (Signed)
Admit Date: 03/27/2015 Today's Date: 03/28/2015  Subjective: Postpartum Day 1: Cesarean Delivery Patient reports tolerating PO and no problems voiding.    Objective: Vital signs in last 24 hours: Temp:  [97.5 F (36.4 C)-98.9 F (37.2 C)] 98.5 F (36.9 C) (10/07 0801) Pulse Rate:  [59-78] 69 (10/07 0801) Resp:  [16-20] 18 (10/07 0801) BP: (97-130)/(56-85) 108/66 mmHg (10/07 0801) SpO2:  [98 %-100 %] 98 % (10/07 0255)  Physical Exam:  General: alert and cooperative Lochia: appropriate Uterine Fundus: firm Incision: dressing dry; OnQ in place DVT Evaluation: No evidence of DVT seen on physical exam.   Recent Labs  03/26/15 1048 03/28/15 0503  HGB 11.9* 10.0*  HCT 36.4 29.5*    Assessment/Plan: Status post Cesarean section. Doing well postoperatively.  Continue current care.   Diet, Amb, Shower, Change dressing Plans condoms Breast feeding  Oluwaseyi Raffel PAUL 03/28/2015, 10:35 AM

## 2015-03-28 NOTE — Anesthesia Postprocedure Evaluation (Signed)
  Anesthesia Post-op Note  Patient: Yesenia Moore  Procedure(s) Performed: Procedure(s): CESAREAN SECTION (N/A)  Anesthesia type:Regional, Spinal  Patient location: PACU  Post pain: Pain level controlled  Post assessment: Post-op Vital signs reviewed, Patient's Cardiovascular Status Stable, Respiratory Function Stable, Patent Airway and No signs of Nausea or vomiting  Post vital signs: Reviewed and stable  Last Vitals:  Filed Vitals:   03/28/15 1142  BP: 108/58  Pulse: 78  Temp: 37.1 C  Resp: 20    Level of consciousness: awake, alert  and patient cooperative  Complications: No apparent anesthesia complications

## 2015-03-29 MED ORDER — BISACODYL 10 MG RE SUPP
10.0000 mg | Freq: Once | RECTAL | Status: AC
  Administered 2015-03-29: 10 mg via RECTAL
  Filled 2015-03-29: qty 1

## 2015-03-29 NOTE — Progress Notes (Signed)
  Subjective:  Doing well no concerns, pain adequately controlled.  Complains of gas, passing flatus.  Minimal lcohai   Objective:  Blood pressure 108/70, pulse 86, temperature 98.7 F (37.1 C), temperature source Oral, resp. rate 18, height $RemoveBe'5\' 5"'QrKvwWbLV$  (1.651 m), weight 70.308 kg (155 lb), SpO2 98 %, unknown if currently breastfeeding.  General: NAD Pulmonary: no increased work of breathing Abdomen: non-distended, non-tender, fundus firm at level of umbilicus Incision: D/C/I Extremities: no edema, no erythema, no tenderness  Results for orders placed or performed during the hospital encounter of 03/27/15 (from the past 72 hour(s))  Glucose, capillary     Status: None   Collection Time: 03/27/15  7:15 AM  Result Value Ref Range   Glucose-Capillary 87 65 - 99 mg/dL  CBC     Status: Abnormal   Collection Time: 03/28/15  5:03 AM  Result Value Ref Range   WBC 10.5 3.6 - 11.0 K/uL   RBC 3.55 (L) 3.80 - 5.20 MIL/uL   Hemoglobin 10.0 (L) 12.0 - 16.0 g/dL   HCT 29.5 (L) 35.0 - 47.0 %   MCV 83.2 80.0 - 100.0 fL   MCH 28.1 26.0 - 34.0 pg   MCHC 33.8 32.0 - 36.0 g/dL   RDW 18.1 (H) 11.5 - 14.5 %   Platelets 116 (L) 150 - 440 K/uL     Assessment:   39 y.o. G2P2001 postoperativeday #2 RLTCS via midline vertical    Plan:  1) Acute blood loss anemia - hemodynamically stable and asymptomatic - po ferrous sulfate  2) O pos / RNI / VZNI - varivax and MMR postpartum  3) TDAP up to date / influenza on discharge  4) Breast/undecided  5) Disposition - home POD3

## 2015-03-30 MED ORDER — INFLUENZA VAC SPLIT QUAD 0.5 ML IM SUSY
0.5000 mL | PREFILLED_SYRINGE | INTRAMUSCULAR | Status: AC
  Administered 2015-03-30: 0.5 mL via INTRAMUSCULAR
  Filled 2015-03-30: qty 0.5

## 2015-03-30 MED ORDER — FERROUS SULFATE 325 (65 FE) MG PO TABS: 325.0000 mg | ORAL_TABLET | Freq: Every day | ORAL | Status: DC

## 2015-03-30 MED ORDER — OXYCODONE-ACETAMINOPHEN 5-325 MG PO TABS: 1.0000 | ORAL_TABLET | ORAL | Status: DC | PRN

## 2015-03-30 MED ORDER — IBUPROFEN 600 MG PO TABS: 600.0000 mg | ORAL_TABLET | Freq: Four times a day (QID) | ORAL | Status: DC | PRN

## 2015-03-30 NOTE — Progress Notes (Signed)
Educated patient on the need to receive her MMR and Varicella vaccine. Patient and husband inquired about getting vaccines at a later date because mom didn't want to receive 3 shots in one day (flu, mmr, varicella). Nurse encouraged patient to get all three before leaving the hospital however patient decided to only get the flu vaccine. Patient only wants flu vaccine because other daughter is in school and they are afraid she may bring the flu home, the nurse also educated patient on the need to protect against measles, mumps, rubella and chicken pox, but patient insisted on only getting the flu.

## 2015-04-02 NOTE — Addendum Note (Signed)
Addendum  created 04/02/15 1233 by Molli Barrows, MD   Modules edited: Anesthesia Events, Narrator   Narrator:  Narrator: Event Log Edited

## 2015-04-10 NOTE — Addendum Note (Signed)
Addendum  created 04/10/15 0855 by Doreen Salvage, CRNA   Modules edited: Notes Section   Notes Section:  File: [id number]

## 2016-03-26 ENCOUNTER — Encounter: Payer: Self-pay | Admitting: Emergency Medicine

## 2016-03-26 ENCOUNTER — Emergency Department
Admission: EM | Admit: 2016-03-26 | Discharge: 2016-03-26 | Disposition: A | Discharge status: Home / Self Care | Payer: BLUE CROSS/BLUE SHIELD | Attending: Emergency Medicine | Admitting: Emergency Medicine

## 2016-03-26 DIAGNOSIS — E119 Type 2 diabetes mellitus without complications: Secondary | ICD-10-CM | POA: Insufficient documentation

## 2016-03-26 DIAGNOSIS — R42 Dizziness and giddiness: Secondary | ICD-10-CM | POA: Insufficient documentation

## 2016-03-26 LAB — BASIC METABOLIC PANEL
Anion gap: 6 (ref 5–15)
BUN: 12 mg/dL (ref 6–20)
CHLORIDE: 106 mmol/L (ref 101–111)
CO2: 25 mmol/L (ref 22–32)
CREATININE: 0.52 mg/dL (ref 0.44–1.00)
Calcium: 9.2 mg/dL (ref 8.9–10.3)
GFR calc non Af Amer: >60 mL/min (ref >60)
Glucose, Bld: 117 mg/dL — ABNORMAL HIGH (ref 65–99)
POTASSIUM: 3.9 mmol/L (ref 3.5–5.1)
SODIUM: 137 mmol/L (ref 135–145)

## 2016-03-26 LAB — CBC WITH DIFFERENTIAL/PLATELET
BASOS ABS: 0.1 10*3/uL (ref 0–0.1)
BASOS PCT: 0 %
EOS ABS: 0 10*3/uL (ref 0–0.7)
EOS PCT: 0 %
HCT: 39.4 % (ref 35.0–47.0)
HEMOGLOBIN: 13.3 g/dL (ref 12.0–16.0)
LYMPHS ABS: 1 10*3/uL (ref 1.0–3.6)
Lymphocytes Relative: 8 %
MCH: 28.4 pg (ref 26.0–34.0)
MCHC: 33.8 g/dL (ref 32.0–36.0)
MCV: 84.2 fL (ref 80.0–100.0)
Monocytes Absolute: 0.7 10*3/uL (ref 0.2–0.9)
Monocytes Relative: 6 %
NEUTROS PCT: 86 %
Neutro Abs: 10.7 10*3/uL — ABNORMAL HIGH (ref 1.4–6.5)
PLATELETS: 180 10*3/uL (ref 150–440)
RBC: 4.67 MIL/uL (ref 3.80–5.20)
RDW: 14.2 % (ref 11.5–14.5)
WBC: 12.5 10*3/uL — AB (ref 3.6–11.0)

## 2016-03-26 LAB — TSH: TSH: 0.892 u[IU]/mL (ref 0.350–4.500)

## 2016-03-26 MED ORDER — ONDANSETRON HCL 4 MG PO TABS: 4.0000 mg | ORAL_TABLET | Freq: Three times a day (TID) | ORAL | 1 refills | Status: DC | PRN

## 2016-03-26 MED ORDER — SODIUM CHLORIDE 0.9 % IV BOLUS (SEPSIS)
1000.0000 mL | Freq: Once | INTRAVENOUS | Status: AC
  Administered 2016-03-26: 1000 mL via INTRAVENOUS

## 2016-03-26 NOTE — Discharge Instructions (Addendum)
You examine evaluation are reassuring today in the emergency department, and as we discussed I suspect you have vertigo.  Typically vertigo will be better in about one week.  You may want to try over the counter ibuprofen 600mg  every 8 hours as needed for body/muscle aches, and meclizine (also called antivert) as directed on label.  Because of breastfeeding, we discussed go ahead and dump breast milk after these medications.  Return to the emergency department for any worsening condition including any one-sided weakness, numbness, confusion or altered mental status, seizure, headache, fever, or any other symptoms concerning to you.

## 2016-03-26 NOTE — ED Provider Notes (Signed)
[facility] Emergency Department Provider Note ____________________________________________   I have reviewed the triage vital signs and the triage nursing note.  HISTORY  Chief Complaint Emesis and Dizziness   Historian Patient and husband, choose to speak English, decline interpreter  HPI Yesenia Moore is a 40 y.o. female states that she had upper respiratory congestion including sore throat and fever Wednesday and Thursday and yesterday evening started to feel a little bit better. This morning she woke up overnight to breast-feed her son and felt room spinning dizziness as well as nausea and near vomiting when she tried to stand up and walk around. She states that she feels off balance. No specific weakness or numbness. No confusion or altered mental status. No history of migraines. No history of known vertigo.  Dizziness and room spinning happens when she moves her head side-to-side or changes positions.  History of gestational diabetes for which she had followed with Dr. Gabriel Carina, endocrinology.  Goes to the clinic at [facility] where her husband works.  Reports that about a month ago when she was in the [place] she had an accident followed by an MRI to evaluate for possible injury or concussion. They state that she had been told that there is a cyst that is going to be checked in about a year.     Past Medical History:  Diagnosis Date  . Anemia   . Diabetes mellitus without complication (HCC)    Gestational diabetes  . Thyroid disease     Patient Active Problem List   Diagnosis Date Noted  . S/P cesarean section 03/27/2015  . 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 CESAREAN SECTION    . CESAREAN SECTION N/A 03/27/2015   Procedure: CESAREAN SECTION;  Surgeon: Will Bonnet, MD;  Location: [facility] ORS;  Service: Obstetrics;  Laterality: N/A;    Prior to Admission medications    Medication Sig Start Date End Date Taking? Authorizing Provider  levothyroxine (SYNTHROID, LEVOTHROID) 75 MCG tablet Take 75 mcg by mouth daily before breakfast.   Yes Historical Provider, MD  levothyroxine (SYNTHROID) 25 MCG tablet Take 1 tablet by mouth daily. 07/16/14   Historical Provider, MD    Allergies  Allergen Reactions  . Lidocaine Other (See Comments)    "fast heart beat, dizziness"    History reviewed. No pertinent family history.  Social History Social History  Substance Use Topics  . Smoking status: Never Smoker  . Smokeless tobacco: Never Used  . Alcohol use No    Review of Systems  Constitutional: Negative for fever Since yesterday. Eyes: Negative for visual changes. ENT: Sore throat now resolved. Cardiovascular: Negative for chest pain. Respiratory: Negative for shortness of breath. Gastrointestinal: Negative for abdominal pain or diarrhea. She did have some nausea. Genitourinary:  Musculoskeletal: Negative for back pain. Skin: Negative for rash. Neurological: Negative for headache.  Dizziness described as room spinning as per history of present illness. 10 point Review of Systems otherwise negative ____________________________________________   PHYSICAL EXAM:  VITAL SIGNS: ED Triage Vitals  Enc Vitals Group     BP 03/26/16 0656 120/85     Pulse Rate 03/26/16 0656 85     Resp 03/26/16 0656 10     Temp 03/26/16 0656 97.9 F (36.6 C)     Temp Source 03/26/16 0656 Oral     SpO2 03/26/16 0656 97 %     Weight 03/26/16 0649 141 lb (64 kg)  Height 03/26/16 0649 5\' 5"  (1.651 m)     Head Circumference --      Peak Flow --      Pain Score 03/26/16 0650 0     Pain Loc --      Pain Edu? --      Excl. in [place]? --      Constitutional: Alert and oriented. Well appearing and in no distress. HEENT   Head: Normocephalic and atraumatic.      Eyes: Conjunctivae are normal. PERRL. Normal extraocular movements.      Ears:   TMs normal bilaterally.       Nose: No congestion/rhinnorhea.   Mouth/Throat: Mucous membranes are moist.   Neck: No stridor.  No neck stiffness. Cardiovascular/Chest: Normal rate, regular rhythm.  No murmurs, rubs, or gallops. Respiratory: Normal respiratory effort without tachypnea nor retractions. Breath sounds are clear and equal bilaterally. No wheezes/rales/rhonchi. Gastrointestinal: Soft. No distention, no guarding, no rebound. Nontender.    Genitourinary/rectal:Deferred Musculoskeletal: Nontender with normal range of motion in all extremities. No joint effusions.  No lower extremity tenderness.  No edema. Neurologic:  No facial droop. Normal speech and language. No gross or focal neurologic deficits are appreciated.  5/5 strength in 4 extremities. No sensory deficits. Finger-nose intact bilaterally. No pronator drift. Skin:  Skin is warm, dry and intact. No rash noted. Psychiatric: Mood and affect are normal. Speech and behavior are normal. Patient exhibits appropriate insight and judgment.   ____________________________________________  LABS (pertinent positives/negatives)  Labs Reviewed  BASIC METABOLIC PANEL - Abnormal; Notable for the following:       Result Value   Glucose, Bld 117 (*)    All other components within normal limits  CBC WITH DIFFERENTIAL/PLATELET - Abnormal; Notable for the following:    WBC 12.5 (*)    Neutro Abs 10.7 (*)    All other components within normal limits  TSH    ____________________________________________    EKG I, Lisa Roca, MD, the attending physician have personally viewed and interpreted all ECGs.  80 bpm. Normal sinus rhythm. Narrow QRS. Normal axis. Normal ST and T-wave ____________________________________________  RADIOLOGY All Xrays were viewed by me. Imaging interpreted by Radiologist.  None __________________________________________  PROCEDURES  Procedure(s) performed: None  Critical Care performed:  None  ____________________________________________   ED COURSE / ASSESSMENT AND PLAN  Pertinent labs & imaging results that were available during my care of the patient were reviewed by me and considered in my medical decision making (see chart for details).   Ms. Boutros is here for dizziness further described as room spinning with nausea. I think her symptoms sound like peripheral vertigo. She recently had a upper respiratory infection, but most of those symptoms are resolved per her. On exam she has no focal neurologic deficit, and I am less suspicious for an intracranial emergency. She did have an MRI reportedly within the past month. Given the onset at night, I was questioning the possibility for tumor, but again highly unlikely especially in the setting of recent brain imaging within 1 month.  No significant headache, I don't think this seems like subarachnoid hemorrhage or other intracranial hemorrhage or stroke. I discussed with the patient and her husband that at this point I do not recommend head imaging in terms of risk versus benefit for CT radiation.  She is breast-feeding and so I chose to hold off on first-generation antihistamine including Benadryl and meclizine which are modified otherwise use to help with symptoms of vertigo. I did go  ahead and give her a liter of fluid and Zofran.  On reevaluation she feels somewhat better, she is complaining that she does have a mild headache. Overall she is very well-appearing. She was questioning a diagnosis of meningitis, she hasn't been on recent antibiotics, and does not clinically appear to be concerning for bacterial meningitis. We discussed additional testing for this would be lumbar puncture, and I did not recommend that at this point time.  She does seem pretty anxious, about fatigue and her thyroid level, and her vitamin D level. I have asked that she follow up with both her endocrinologist and [facility]. The  only abnormality on her blood work was the slightly elevated at 117.  In any case, I think that she is okay for discharge. I have recommended that if she is having symptoms she might try ibuprofen and meclizine over-the-counter, and out of concern about side effects and sedation with breast feeding, asked her to go ahead and pump and dump. The child feeds only once or twice per day on breast-feeding, and typically eats mostly food throughout the day.    CONSULTATIONS:  None   Patient / Family / Caregiver informed of clinical course, medical decision-making process, and agree with plan.   I discussed return precautions, follow-up instructions, and discharge instructions with patient and/or family.   ___________________________________________   FINAL CLINICAL IMPRESSION(S) / ED DIAGNOSES   Final diagnoses:  Vertigo              Note: This dictation was prepared with Dragon dictation. Any transcriptional errors that result from this process are unintentional    Lisa Roca, MD 03/26/16 1127

## 2016-03-26 NOTE — ED Notes (Signed)
Pt complaining of dizziness when moving head and getting up. C/o of n/v x 2 since 1am.  Pain in left chest with deep breaths.

## 2016-03-26 NOTE — ED Notes (Signed)
AAOx3.  Skin warm and dry.  NAD 

## 2016-03-26 NOTE — ED Triage Notes (Signed)
Pt arrived to the ED accompanied by her husband for complaints of nausea, vomiting and dizziness. Pt reports that she started to feel bad last night and today is worse. Pt is AOx4 in mild distress.

## 2016-09-15 DIAGNOSIS — R5381 Other malaise: Secondary | ICD-10-CM | POA: Diagnosis not present

## 2016-09-15 DIAGNOSIS — E039 Hypothyroidism, unspecified: Secondary | ICD-10-CM | POA: Diagnosis not present

## 2016-09-16 DIAGNOSIS — E039 Hypothyroidism, unspecified: Secondary | ICD-10-CM | POA: Diagnosis not present

## 2016-09-16 DIAGNOSIS — R5381 Other malaise: Secondary | ICD-10-CM | POA: Diagnosis not present

## 2016-10-18 ENCOUNTER — Encounter: Payer: Self-pay | Admitting: Obstetrics and Gynecology

## 2016-10-18 ENCOUNTER — Ambulatory Visit (INDEPENDENT_AMBULATORY_CARE_PROVIDER_SITE_OTHER): Payer: BLUE CROSS/BLUE SHIELD | Admitting: Obstetrics and Gynecology

## 2016-10-18 VITALS — BP 110/80 | HR 83 | Ht 65.0 in | Wt 136.0 lb

## 2016-10-18 DIAGNOSIS — Z124 Encounter for screening for malignant neoplasm of cervix: Secondary | ICD-10-CM

## 2016-10-18 DIAGNOSIS — Z0000 Encounter for general adult medical examination without abnormal findings: Secondary | ICD-10-CM

## 2016-10-18 DIAGNOSIS — Z131 Encounter for screening for diabetes mellitus: Secondary | ICD-10-CM

## 2016-10-18 DIAGNOSIS — Z01419 Encounter for gynecological examination (general) (routine) without abnormal findings: Secondary | ICD-10-CM | POA: Diagnosis not present

## 2016-10-18 NOTE — Progress Notes (Signed)
Gynecology Annual Exam  PCP: Pcp Not In System  Chief Complaint  Patient presents with  . Gynecologic Exam    History of Present Illness:  Yesenia Moore is a 41 y.o. G2P2002 who LMP was Patient's last menstrual period was 10/01/2016 (exact date)., presents today for her annual examination.  Her menses are regular every 28-30 days, lasting 3 day(s).  Dysmenorrhea none. She does not have intermenstrual bleeding.  She is single partner, contraception - condoms always.  Last Pap: August 18, 2015  Results were: no abnormalities /neg HPV DNA negative Hx of STDs: none  Last mammogram: n/a There is no FH of breast cancer. There is no FH of ovarian cancer. The patient does not do self-breast exams.  Tobacco use: The patient denies current or previous tobacco use. Alcohol use: none Exercise: moderately active  She does get adequate calcium and Vitamin D in her diet.  Reports that she would like to have a check for diabetes due to having GDM with her prior pregnancy.   The patient wears seatbelts: yes.     Review of Systems: Review of Systems  Constitutional: Negative.   HENT: Negative.   Eyes: Negative.   Respiratory: Negative.   Cardiovascular: Negative.   Gastrointestinal: Negative.   Genitourinary: Negative.   Musculoskeletal: Negative.   Skin: Negative.   Neurological: Negative.   Psychiatric/Behavioral: Negative.     Past Medical History:  Diagnosis Date  . Anemia   . Diabetes mellitus without complication (HCC)    Gestational diabetes  . Hashimoto's thyroiditis   . Thyroid disease     Past Surgical History:  Procedure Laterality Date  . CESAREAN SECTION    . CESAREAN SECTION N/A 03/27/2015   Procedure: CESAREAN SECTION;  Surgeon: Will Bonnet, MD;  Location: [facility] ORS;  Service: Obstetrics;  Laterality: N/A;    Medications:   Medication Sig Start Date End Date Taking? Authorizing Provider  levothyroxine (SYNTHROID, LEVOTHROID) 75 MCG tablet Take by  mouth. 09/20/16  Yes Historical Provider, MD    Allergies  Allergen Reactions  . Lidocaine Other (See Comments)    "fast heart beat, dizziness"    Gynecologic History: Patient's last menstrual period was 10/01/2016 (exact date).  Obstetric History: [id number], s/p c-section x 2.  Social History   Social History  . Marital status: Married    Spouse name: N/A  . Number of children: N/A  . Years of education: N/A   Occupational History  . Not on file.   Social History Main Topics  . Smoking status: Never Smoker  . Smokeless tobacco: Never Used  . Alcohol use No  . Drug use: No  . Sexual activity: Yes    Birth control/ protection: Condom   Other Topics Concern  . Not on file   Social History Narrative  . No narrative on file    Family History: denies history of gynecologic cancer  Physical Exam BP 110/80   Pulse 83   Ht 5\' 5"  (1.651 m)   Wt 136 lb (61.7 kg)   LMP 10/01/2016 (Exact Date)   SpO2 99%   BMI 22.63 kg/m   Physical Exam  Constitutional: She is oriented to person, place, and time. She appears well-developed and well-nourished. No distress.  Genitourinary: Uterus normal. Pelvic exam was performed with patient supine. There is no rash, tenderness, lesion or injury on the right labia. There is no rash, tenderness, lesion or injury on the left labia. No erythema, tenderness or bleeding in the vagina. No  signs of injury around the vagina. No vaginal discharge found. Right adnexum does not display mass, does not display tenderness and does not display fullness. Left adnexum does not display mass, does not display tenderness and does not display fullness. Cervix does not exhibit motion tenderness or discharge.   Uterus is mobile and anteverted. Uterus is not enlarged, tender or exhibiting a mass.  HENT:  Head: Normocephalic and atraumatic.  Eyes: EOM are normal. No scleral icterus.  Neck: Normal range of motion. Neck supple. No thyromegaly present.  Cardiovascular:  Normal rate and regular rhythm.  Exam reveals no gallop and no friction rub.   No murmur heard. Pulmonary/Chest: Effort normal and breath sounds normal. No respiratory distress. She has no wheezes. She has no rales.  Breast exam deferred due to current breast feeding.   Abdominal: Soft. Bowel sounds are normal. She exhibits no distension and no mass. There is no tenderness. There is no rebound and no guarding.  Musculoskeletal: Normal range of motion. She exhibits no edema or tenderness.  Lymphadenopathy:    She has no cervical adenopathy.       Right: No inguinal adenopathy present.       Left: No inguinal adenopathy present.  Neurological: She is alert and oriented to person, place, and time. No cranial nerve deficit.  Skin: Skin is warm and dry. No rash noted. No erythema.  Psychiatric: She has a normal mood and affect. Her behavior is normal. Judgment normal.   Female chaperone present for pelvic and breast  portions of the physical exam  Assessment: 41 y.o. [id number] here for routine annual gynecologic examination, doing well.   Plan:  1) Contraception Education given regarding options for contraception. She prefers to continue using barrier methods.  2) STI screening was offered and declined  3) Pap - ASCCP guidelines and rational discussed.  Patient opts for routine screening interval if this pap smear is normal.   4) Routine healthcare maintenance including diabetes screening given her history of gestational diabetes, which gives her an increased risk of developing type 2 diabetes.   5) Follow up 1 year for routine annual exam  Prentice Docker, MD 10/18/2016 5:24 PM

## 2016-10-20 LAB — PROTEIN / CREATININE RATIO, URINE
Creatinine, Urine: 63.3 mg/dL
Protein, Ur: 18.3 mg/dL
Protein/Creat Ratio: 289 mg/g creat — ABNORMAL HIGH (ref 0–200)

## 2016-10-23 LAB — IGP, APTIMA HPV, RFX 16/18,45
HPV Aptima: NEGATIVE
PAP SMEAR COMMENT: 0

## 2016-11-03 ENCOUNTER — Encounter: Payer: Self-pay | Admitting: Obstetrics and Gynecology

## 2016-11-17 ENCOUNTER — Encounter: Payer: Self-pay | Admitting: Medical

## 2016-11-17 ENCOUNTER — Ambulatory Visit: Payer: BLUE CROSS/BLUE SHIELD | Admitting: Medical

## 2016-11-17 VITALS — BP 125/80 | HR 99 | Temp 99.2°F | Resp 16 | Ht 64.0 in | Wt 135.0 lb

## 2016-11-17 DIAGNOSIS — H1033 Unspecified acute conjunctivitis, bilateral: Secondary | ICD-10-CM

## 2016-11-17 MED ORDER — GENTAMICIN SULFATE 0.3 % OP SOLN: 1.0000 [drp] | OPHTHALMIC | 0 refills | Status: DC

## 2016-11-17 NOTE — Progress Notes (Signed)
   Subjective:    Patient ID: Yesenia Moore, female    DOB: 01/07/1976, 41 y.o.   MRN: [id number]  HPI 41 yo female  with  5 days of eye itching and discharge after sleeping.  Tried using sons drops without any rellef, Polymyxin B Sulfate and Trimethoprim opthalmic solution, he too had had conjunctivitis. These drops caused her to itch even more. No visual changes.   Review of Systems  Constitutional: Negative for chills and fever.  Eyes: Positive for discharge and itching.       Objective:   Physical Exam  Constitutional: She is oriented to person, place, and time. She appears well-developed and well-nourished.  HENT:  Head: Normocephalic and atraumatic.  Right Ear: Hearing, tympanic membrane, external ear and ear canal normal.  Left Ear: Hearing and ear canal normal. A middle ear effusion is present.  Eyes: EOM are normal. Pupils are equal, round, and reactive to light. Left eye exhibits no discharge. Right conjunctiva is injected. Left conjunctiva is injected.  Fundoscopic exam:      The right eye shows red reflex.       The left eye shows red reflex.  Neck: Normal range of motion and full passive range of motion without pain. Neck supple.  Cardiovascular: Normal rate, regular rhythm and normal heart sounds.  Exam reveals no gallop and no friction rub.   No murmur heard. Pulmonary/Chest: Effort normal and breath sounds normal.  Neurological: She is alert and oriented to person, place, and time.  Skin: Skin is warm and dry.  Psychiatric: She has a normal mood and affect. Her behavior is normal.   Mild erythema posterior pharynx laterally.uvula wnl. Left ear  Auricle with scaling of skin posterior to canal.       Assessment & Plan:  Conjunctivitis  Bilateral  E-prescribed  Gentamicin 0.3% opth gtts one drop each eye every 4 hours while awake x 7 days. Wash hands before and after applying medication.  Blot don't rub eyes. Use own towel and washcloth separate from family members.   Return to the clinic in 3 -4 days if not improving. Dry skin left ear auricle apply  OTC  Hydrocortisone cream 1% to  Affected area twice daily x 7 days, given 3 packets from clinic [id number] exp 5/20. Return to the clinic in  3-5 days if not improving.

## 2016-11-21 DIAGNOSIS — H109 Unspecified conjunctivitis: Secondary | ICD-10-CM | POA: Diagnosis not present

## 2016-11-24 DIAGNOSIS — H01119 Allergic dermatitis of unspecified eye, unspecified eyelid: Secondary | ICD-10-CM | POA: Diagnosis not present

## 2016-12-01 ENCOUNTER — Other Ambulatory Visit: Payer: Self-pay | Admitting: Obstetrics and Gynecology

## 2016-12-01 ENCOUNTER — Other Ambulatory Visit: Payer: BLUE CROSS/BLUE SHIELD

## 2016-12-01 DIAGNOSIS — Z01419 Encounter for gynecological examination (general) (routine) without abnormal findings: Secondary | ICD-10-CM | POA: Diagnosis not present

## 2016-12-01 DIAGNOSIS — E039 Hypothyroidism, unspecified: Secondary | ICD-10-CM

## 2016-12-01 DIAGNOSIS — Z131 Encounter for screening for diabetes mellitus: Secondary | ICD-10-CM

## 2016-12-01 DIAGNOSIS — Z0000 Encounter for general adult medical examination without abnormal findings: Secondary | ICD-10-CM | POA: Diagnosis not present

## 2016-12-01 NOTE — Addendum Note (Signed)
Addended by: Prentice Docker D on: 12/01/2016 10:08 AM   Modules accepted: Orders

## 2016-12-02 LAB — BASIC METABOLIC PANEL
BUN/Creatinine Ratio: 13 (ref 9–23)
BUN: 8 mg/dL (ref 6–20)
CO2: 24 mmol/L (ref 20–29)
CREATININE: 0.63 mg/dL (ref 0.57–1.00)
Calcium: 9.2 mg/dL (ref 8.7–10.2)
Chloride: 105 mmol/L (ref 96–106)
GFR calc Af Amer: 132 mL/min/{1.73_m2} (ref >59)
GFR calc non Af Amer: 114 mL/min/{1.73_m2} (ref >59)
Glucose: 90 mg/dL (ref 65–99)
POTASSIUM: 4.2 mmol/L (ref 3.5–5.2)
SODIUM: 142 mmol/L (ref 134–144)

## 2016-12-02 LAB — TSH+FREE T4
Free T4: 1.34 ng/dL (ref 0.82–1.77)
TSH: 1.38 u[IU]/mL (ref 0.450–4.500)

## 2016-12-02 LAB — HGB A1C W/O EAG: HEMOGLOBIN A1C: 5.5 % (ref 4.8–5.6)

## 2016-12-06 ENCOUNTER — Telehealth: Payer: Self-pay | Admitting: Obstetrics and Gynecology

## 2016-12-06 NOTE — Telephone Encounter (Signed)
Pt request a call back to get her lab results from labs done on 12/01/16. Please advise. Thanks TNP

## 2016-12-07 ENCOUNTER — Telehealth: Payer: Self-pay | Admitting: Obstetrics and Gynecology

## 2016-12-07 NOTE — Telephone Encounter (Signed)
Pt is calling to find out about her result. cb # [phone]

## 2016-12-07 NOTE — Telephone Encounter (Signed)
See previous msg, sent to [facility]. Pt aware via vm

## 2016-12-08 NOTE — Telephone Encounter (Signed)
Pt aware that [facility] will call her tomorrow to go over results.

## 2016-12-08 NOTE — Telephone Encounter (Signed)
Pt is calling to follow up about her lab result. Please advise

## 2016-12-09 ENCOUNTER — Encounter: Payer: Self-pay | Admitting: Obstetrics and Gynecology

## 2016-12-09 NOTE — Telephone Encounter (Signed)
See other note

## 2016-12-09 NOTE — Telephone Encounter (Signed)
I have spoken with patient. I reviewed in detail her lab results. She will be sent a letter with her lab results.  She understands to contact a general medicine doctor to help her figure out if any action is needed with her proteinuria and her symptoms of general malaise.

## 2016-12-16 ENCOUNTER — Telehealth: Payer: Self-pay | Admitting: Obstetrics and Gynecology

## 2016-12-16 NOTE — Telephone Encounter (Signed)
Pt is requesting needing to have a referral for her "Kidneys" to an Urologist. Pt would also like to have her labs resent to her My Chart. Please advise.

## 2016-12-21 ENCOUNTER — Other Ambulatory Visit: Payer: Self-pay | Admitting: Obstetrics and Gynecology

## 2016-12-21 DIAGNOSIS — R809 Proteinuria, unspecified: Secondary | ICD-10-CM | POA: Insufficient documentation

## 2016-12-21 NOTE — Telephone Encounter (Signed)
Trying to call pt, no answer. Lm with her. If she calls back please relay msg from [place]. Thank you.

## 2016-12-21 NOTE — Telephone Encounter (Signed)
I resent the patient a code to activate her MyChart account.  Please contact her to let her know that I put this request in and I sent a referral for a nephrologist (kidney doctor). Thank you

## 2017-01-12 DIAGNOSIS — R809 Proteinuria, unspecified: Secondary | ICD-10-CM | POA: Diagnosis not present

## 2017-03-03 DIAGNOSIS — M9902 Segmental and somatic dysfunction of thoracic region: Secondary | ICD-10-CM | POA: Diagnosis not present

## 2017-03-03 DIAGNOSIS — M542 Cervicalgia: Secondary | ICD-10-CM | POA: Diagnosis not present

## 2017-03-03 DIAGNOSIS — M5413 Radiculopathy, cervicothoracic region: Secondary | ICD-10-CM | POA: Diagnosis not present

## 2017-03-03 DIAGNOSIS — M9901 Segmental and somatic dysfunction of cervical region: Secondary | ICD-10-CM | POA: Diagnosis not present

## 2017-03-07 DIAGNOSIS — M5413 Radiculopathy, cervicothoracic region: Secondary | ICD-10-CM | POA: Diagnosis not present

## 2017-03-07 DIAGNOSIS — M9902 Segmental and somatic dysfunction of thoracic region: Secondary | ICD-10-CM | POA: Diagnosis not present

## 2017-03-07 DIAGNOSIS — M9901 Segmental and somatic dysfunction of cervical region: Secondary | ICD-10-CM | POA: Diagnosis not present

## 2017-03-07 DIAGNOSIS — M542 Cervicalgia: Secondary | ICD-10-CM | POA: Diagnosis not present

## 2017-04-29 DIAGNOSIS — E039 Hypothyroidism, unspecified: Secondary | ICD-10-CM | POA: Diagnosis not present

## 2017-05-02 DIAGNOSIS — E038 Other specified hypothyroidism: Secondary | ICD-10-CM | POA: Diagnosis not present

## 2017-05-02 DIAGNOSIS — E063 Autoimmune thyroiditis: Secondary | ICD-10-CM | POA: Diagnosis not present

## 2017-05-09 DIAGNOSIS — L710 Perioral dermatitis: Secondary | ICD-10-CM | POA: Diagnosis not present

## 2017-06-10 DIAGNOSIS — Z7689 Persons encountering health services in other specified circumstances: Secondary | ICD-10-CM | POA: Diagnosis not present

## 2017-06-10 DIAGNOSIS — R21 Rash and other nonspecific skin eruption: Secondary | ICD-10-CM | POA: Diagnosis not present

## 2017-06-23 DIAGNOSIS — Z0000 Encounter for general adult medical examination without abnormal findings: Secondary | ICD-10-CM | POA: Diagnosis not present

## 2017-06-23 DIAGNOSIS — E063 Autoimmune thyroiditis: Secondary | ICD-10-CM | POA: Diagnosis not present

## 2017-06-23 DIAGNOSIS — R21 Rash and other nonspecific skin eruption: Secondary | ICD-10-CM | POA: Diagnosis not present

## 2017-06-23 DIAGNOSIS — Z7689 Persons encountering health services in other specified circumstances: Secondary | ICD-10-CM | POA: Diagnosis not present

## 2017-09-27 DIAGNOSIS — J069 Acute upper respiratory infection, unspecified: Secondary | ICD-10-CM | POA: Diagnosis not present

## 2017-09-27 DIAGNOSIS — R7989 Other specified abnormal findings of blood chemistry: Secondary | ICD-10-CM | POA: Diagnosis not present

## 2017-09-27 DIAGNOSIS — E063 Autoimmune thyroiditis: Secondary | ICD-10-CM | POA: Diagnosis not present

## 2017-09-29 ENCOUNTER — Ambulatory Visit: Payer: BLUE CROSS/BLUE SHIELD | Admitting: Adult Health

## 2017-09-29 ENCOUNTER — Encounter: Payer: Self-pay | Admitting: Adult Health

## 2017-09-29 VITALS — BP 138/82 | HR 100 | Temp 99.0°F | Resp 16 | Ht 62.0 in | Wt 123.0 lb

## 2017-09-29 DIAGNOSIS — J029 Acute pharyngitis, unspecified: Secondary | ICD-10-CM

## 2017-09-29 DIAGNOSIS — H6690 Otitis media, unspecified, unspecified ear: Secondary | ICD-10-CM

## 2017-09-29 MED ORDER — AMOXICILLIN 875 MG PO TABS: 875.0000 mg | ORAL_TABLET | Freq: Two times a day (BID) | ORAL | 0 refills | Status: DC

## 2017-09-29 NOTE — Progress Notes (Signed)
Subjective:     Patient ID: Yesenia Moore, female   DOB: 09-15-1975, 42 y.o.   MRN: [id number]  HPI  Patient is a 42 year old female in no acute distress who comes the clinic she has had sore throat,cough,fatigue she was seen on 09/27/16. She reports she is taking Cefdinir x 6 doses per her report. She feels her other symptoms have improved but her throat has hurts more and right ear pain has started.   She reports she has small children who have also been sick.   Patient reports she was given Amoxicillin for upcoming root canal but did not take medication or pick up- this was previously.   Patient  denies any  body aches,chills, rash, chest pain, shortness of breath, nausea, vomiting, or diarrhea.  Patient's last menstrual period was 09/14/2017.     Filed Weights   09/29/17 1017  Weight: 123 lb (55.8 kg)    Allergies  Allergen Reactions  . Lidocaine Other (See Comments)    "fast heart beat, dizziness"   Patient Active Problem List   Diagnosis Date Noted  . Proteinuria 12/21/2016  . S/P cesarean section 03/27/2015  . Hashimoto's thyroiditis 05/12/2014    Current Outpatient Medications:  .  levothyroxine (SYNTHROID, LEVOTHROID) 75 MCG tablet, Take by mouth., Disp: , Rfl:  .  Vitamin D, Ergocalciferol, (DRISDOL) 50000 units CAPS capsule, Take by mouth., Disp: , Rfl:  .  amoxicillin (AMOXIL) 875 MG tablet, Take 1 tablet (875 mg total) by mouth 2 (two) times daily., Disp: 20 tablet, Rfl: 0 .  Vitamin D, Ergocalciferol, (DRISDOL) 50000 units CAPS capsule, TAKE ONE CAPSULE BY MOUTH ONE TIME PER WEEK, Disp: , Rfl: 5  Review of Systems  Constitutional: Positive for fever.  HENT: Positive for ear pain and sore throat. Negative for postnasal drip.   Respiratory: Positive for cough (intermittent non productive ). Negative for apnea, choking, chest tightness, shortness of breath, wheezing and stridor.   Cardiovascular: Negative.   Gastrointestinal: Negative.   Genitourinary: Negative.    Musculoskeletal: Negative.   Neurological: Negative.   Hematological: Negative.   Psychiatric/Behavioral: Negative.        Objective:   Physical Exam  Constitutional: She is oriented to person, place, and time. Vital signs are normal. She appears well-developed and well-nourished. She is active.  Non-toxic appearance. She does not have a sickly appearance. She does not appear ill. No distress.  Patient is alert and oriented and responsive to questions Engages in eye contact with provider. Speaks in full sentences without any pauses without any shortness of breath.    HENT:  Head: Normocephalic and atraumatic.  Right Ear: There is tenderness (mild with otoscope insertion/ canal normal/ mastoid normal ). Tympanic membrane is erythematous. Tympanic membrane is not perforated and not retracted. A middle ear effusion is present.  Left Ear: Tympanic membrane is not perforated and not erythematous.  No middle ear effusion. No decreased hearing is noted.  Nose: Nose normal.  Mouth/Throat: Uvula is midline and mucous membranes are normal. Oropharyngeal exudate (mild bilateral tonsils ) and posterior oropharyngeal erythema present. No posterior oropharyngeal edema.  Eyes: Pupils are equal, round, and reactive to light. Conjunctivae, EOM and lids are normal. Right eye exhibits no discharge. Left eye exhibits no discharge. No scleral icterus.  Neck: Normal range of motion. Neck supple. No JVD present. No tracheal deviation present.  Cardiovascular: Normal rate, regular rhythm, normal heart sounds and intact distal pulses. Exam reveals no gallop and no friction rub.  No  murmur heard. Pulmonary/Chest: Effort normal and breath sounds normal. No stridor. No respiratory distress. She has no wheezes. She has no rales. She exhibits no tenderness.  Abdominal: Soft. Bowel sounds are normal.  Musculoskeletal: Normal range of motion.  Lymphadenopathy:       Head (right side): No submental, no submandibular, no  tonsillar, no preauricular, no posterior auricular and no occipital adenopathy present.       Head (left side): No submental, no submandibular, no tonsillar, no preauricular, no posterior auricular and no occipital adenopathy present.    She has no cervical adenopathy.  Neurological: She is alert and oriented to person, place, and time. She displays normal reflexes. No cranial nerve deficit. She exhibits normal muscle tone. Coordination normal.  Patient moves on and off of exam table and in room without difficulty. Gait is normal in hall and in room. Patient is oriented to person place time and situation. Patient answers questions appropriately and engages in conversation.   Skin: Skin is warm and dry. No rash noted. She is not diaphoretic. No erythema. No pallor.  Psychiatric: She has a normal mood and affect. Her behavior is normal. Judgment and thought content normal.  Vitals reviewed.      Assessment:     Acute otitis media, unspecified otitis media type  Pharyngitis, unspecified etiology      Plan:      Meds ordered this encounter  Medications  . amoxicillin (AMOXIL) 875 MG tablet    Sig: Take 1 tablet (875 mg total) by mouth 2 (two) times daily.    Dispense:  20 tablet    Refill:  0      Discontinue Cefdnir today  will start  Amoxicillin as above. Do not take any other antibiotics previously prescribed with this one.   Advised patient call the office or your primary care doctor for an appointment if no improvement within 72 hours or if any symptoms change or worsen at any time  Advised ER or urgent Care if after hours or on weekend. Call 911 for emergency symptoms at any time.Patinet verbalized understanding of all instructions given/reviewed and treatment plan and has no further questions or concerns at this time.

## 2017-09-29 NOTE — Patient Instructions (Addendum)
  Stop Cefdnir  Otitis Media, Adult Otitis media is redness, soreness, and puffiness (swelling) in the space just behind your eardrum (middle ear). It may be caused by allergies or infection. It often happens along with a cold. Follow these instructions at home:  Take your medicine as told. Finish it even if you start to feel better.  Only take over-the-counter or prescription medicines for pain, discomfort, or fever as told by your doctor.  Follow up with your doctor as told. Contact a doctor if:  You have otitis media only in one ear, or bleeding from your nose, or both.  You notice a lump on your neck.  You are not getting better in 3-5 days.  You feel worse instead of better. Get help right away if:  You have pain that is not helped with medicine.  You have puffiness, redness, or pain around your ear.  You get a stiff neck.  You cannot move part of your face (paralysis).  You notice that the bone behind your ear hurts when you touch it. This information is not intended to replace advice given to you by your health care provider. Make sure you discuss any questions you have with your health care provider. Document Released: 11/24/2007 Document Revised: 11/13/2015 Document Reviewed: 01/02/2013 Elsevier Interactive Patient Education  2017 [employer].

## 2017-10-06 ENCOUNTER — Encounter: Payer: Self-pay | Admitting: Adult Health

## 2017-10-06 ENCOUNTER — Ambulatory Visit: Payer: BLUE CROSS/BLUE SHIELD | Admitting: Adult Health

## 2017-10-06 VITALS — BP 130/87 | HR 94 | Temp 99.0°F | Resp 16 | Ht 65.0 in | Wt 133.0 lb

## 2017-10-06 DIAGNOSIS — Z91148 Patient's other noncompliance with medication regimen for other reason: Secondary | ICD-10-CM | POA: Insufficient documentation

## 2017-10-06 DIAGNOSIS — J309 Allergic rhinitis, unspecified: Secondary | ICD-10-CM | POA: Insufficient documentation

## 2017-10-06 DIAGNOSIS — Z91199 Patient's noncompliance with other medical treatment and regimen due to unspecified reason: Secondary | ICD-10-CM | POA: Insufficient documentation

## 2017-10-06 DIAGNOSIS — Z9114 Patient's other noncompliance with medication regimen: Secondary | ICD-10-CM

## 2017-10-06 DIAGNOSIS — J029 Acute pharyngitis, unspecified: Secondary | ICD-10-CM | POA: Insufficient documentation

## 2017-10-06 DIAGNOSIS — Z5329 Procedure and treatment not carried out because of patient's decision for other reasons: Secondary | ICD-10-CM | POA: Insufficient documentation

## 2017-10-06 LAB — POCT RAPID STREP A (OFFICE): Rapid Strep A Screen: NEGATIVE

## 2017-10-06 MED ORDER — FLUTICASONE PROPIONATE 50 MCG/ACT NA SUSP: 2.0000 | Freq: Every day | NASAL | 0 refills | Status: DC

## 2017-10-06 NOTE — Patient Instructions (Addendum)
(Fluticasone nasal spray What is this medicine? FLUTICASONE (floo TIK a sone) is a corticosteroid. This medicine is used to treat the symptoms of allergies like sneezing, itchy red eyes, and itchy, runny, or stuffy nose. This medicine is also used to treat nasal polyps. This medicine may be used for other purposes; ask your health care provider or pharmacist if you have questions. COMMON BRAND NAME(S): Flonase, Flonase Allergy Relief, Flonase Sensimist, Veramyst, XHANCE What should I tell my health care provider before I take this medicine? They need to know if you have any of these conditions: -cataracts -glaucoma -infection, like tuberculosis, herpes, or fungal infection -recent surgery on nose or sinuses -taking a corticosteroid by mouth -an unusual or allergic reaction to fluticasone, steroids, other medicines, foods, dyes, or preservatives -pregnant or trying to get pregnant -breast-feeding How should I use this medicine? This medicine is for use in the nose. Follow the directions on your product or prescription label. This medicine works best if used at regular intervals. Do not use more often than directed. Make sure that you are using your nasal spray correctly. After 6 months of daily use for allergies, talk to your doctor or health care professional before using it for a longer time. Ask your doctor or health care professional if you have any questions. Talk to your pediatrician regarding the use of this medicine in children. Special care may be needed. Some products have been used for allergies in children as young as 2 years. After 2 months of daily use without a prescription in a child, talk to your pediatrician before using it for a longer time. Use of this medicine for nasal polyps is not approved in children. Overdosage: If you think you have taken too much of this medicine contact a poison control center or emergency room at once. NOTE: This medicine is only for you. Do not share  this medicine with others. What if I miss a dose? If you miss a dose, use it as soon as you remember. If it is almost time for your next dose, use only that dose and continue with your regular schedule. Do not use double or extra doses. What may interact with this medicine? -certain antibiotics like clarithromycin and telithromycin -certain medicines for fungal infections like ketoconazole, itraconazole, and voriconazole -conivaptan -nefazodone -some medicines for HIV -vaccines This list may not describe all possible interactions. Give your health care provider a list of all the medicines, herbs, non-prescription drugs, or dietary supplements you use. Also tell them if you smoke, drink alcohol, or use illegal drugs. Some items may interact with your medicine. What should I watch for while using this medicine? Visit your doctor or health care professional for regular checks on your progress. Some symptoms may improve within 12 hours after starting use. Check with your doctor or health care professional if there is no improvement in your symptoms after 3 weeks of use. This medicine may increase your risk of getting an infection. Tell your doctor or health care professional if you are around anyone with measles or chickenpox, or if you develop sores or blisters that do not heal properly. What side effects may I notice from receiving this medicine? Side effects that you should report to your doctor or health care professional as soon as possible: -allergic reactions like skin rash, itching or hives, swelling of the face, lips, or tongue -changes in vision -crusting or sores in the nose -nosebleed -signs and symptoms of infection like fever or chills; cough; sore  throat -white patches or sores in the mouth or nose Side effects that usually do not require medical attention (report to your doctor or health care professional if they continue or are bothersome): -burning or irritation inside the nose  or throat -cough -headache -unusual taste or smell This list may not describe all possible side effects. Call your doctor for medical advice about side effects. You may report side effects to FDA at 1-800-FDA-1088. Where should I keep my medicine? Keep out of the reach of children. Store at room temperature between 15 and 30 degrees C (59 and 86 degrees F). Avoid exposure to extreme heat, cold, or light. Throw away any unused medicine after the expiration date. NOTE: This sheet is a summary. It may not cover all possible information. If you have questions about this medicine, talk to your doctor, pharmacist, or health care provider.  2018 Elsevier/Gold Standard (2016-03-19 14:23:12) Zyrtec) Cetirizine tablets-  May try one tablet per package instrictions nightly.  What is this medicine? CETIRIZINE (se TI ra zeen) is an antihistamine. This medicine is used to treat or prevent symptoms of allergies. It is also used to help reduce itchy skin rash and hives. This medicine may be used for other purposes; ask your health care provider or pharmacist if you have questions. COMMON BRAND NAME(S): All Day Allergy, Zyrtec, Zyrtec Hives Relief What should I tell my health care provider before I take this medicine? They need to know if you have any of these conditions: -kidney disease -liver disease -an unusual or allergic reaction to cetirizine, hydroxyzine, other medicines, foods, dyes, or preservatives -pregnant or trying to get pregnant -breast-feeding How should I use this medicine? Take this medicine by mouth with a glass of water. Follow the directions on the prescription label. You can take this medicine with food or on an empty stomach. Take your medicine at regular times. Do not take more often than directed. You may need to take this medicine for several days before your symptoms improve. Talk to your pediatrician regarding the use of this medicine in children. Special care may be needed. While  this drug may be prescribed for children as young as 8 years of age for selected conditions, precautions do apply. Overdosage: If you think you have taken too much of this medicine contact a poison control center or emergency room at once. NOTE: This medicine is only for you. Do not share this medicine with others. What if I miss a dose? If you miss a dose, take it as soon as you can. If it is almost time for your next dose, take only that dose. Do not take double or extra doses. What may interact with this medicine? -alcohol -certain medicines for anxiety or sleep -narcotic medicines for pain -other medicines for colds or allergies This list may not describe all possible interactions. Give your health care provider a list of all the medicines, herbs, non-prescription drugs, or dietary supplements you use. Also tell them if you smoke, drink alcohol, or use illegal drugs. Some items may interact with your medicine. What should I watch for while using this medicine? Visit your doctor or health care professional for regular checks on your health. Tell your doctor if your symptoms do not improve. You may get drowsy or dizzy. Do not drive, use machinery, or do anything that needs mental alertness until you know how this medicine affects you. Do not stand or sit up quickly, especially if you are an older patient. This reduces the risk  of dizzy or fainting spells. Your mouth may get dry. Chewing sugarless gum or sucking hard candy, and drinking plenty of water may help. Contact your doctor if the problem does not go away or is severe. What side effects may I notice from receiving this medicine? Side effects that you should report to your doctor or health care professional as soon as possible: -allergic reactions like skin rash, itching or hives, swelling of the face, lips, or tongue -changes in vision or hearing -fast or irregular heartbeat -trouble passing urine or change in the amount of urine Side  effects that usually do not require medical attention (report to your doctor or health care professional if they continue or are bothersome): -dizziness -dry mouth -irritability -sore throat -stomach pain -tiredness This list may not describe all possible side effects. Call your doctor for medical advice about side effects. You may report side effects to FDA at 1-800-FDA-1088. Where should I keep my medicine? Keep out of the reach of children. Store at room temperature between 15 and 30 degrees C (59 and 86 degrees F). Throw away any unused medicine after the expiration date. NOTE: This sheet is a summary. It may not cover all possible information. If you have questions about this medicine, talk to your doctor, pharmacist, or health care provider.  2018 Elsevier/Gold Standard (2014-07-02 13:44:42) Sore Throat When you have a sore throat, your throat may:  Hurt.  Burn.  Feel irritated.  Feel scratchy.  Many things can cause a sore throat, including:  An infection.  Allergies.  Dryness in the air.  Smoke or pollution.  Gastroesophageal reflux disease (GERD).  A tumor.  A sore throat can be the first sign of another sickness. It can happen with other problems, like coughing or a fever. Most sore throats go away without treatment. Follow these instructions at home:  Take over-the-counter medicines only as told by your doctor.  Drink enough fluids to keep your pee (urine) clear or pale yellow.  Rest when you feel you need to.  To help with pain, try: ? Sipping warm liquids, such as broth, herbal tea, or warm water. ? Eating or drinking cold or frozen liquids, such as frozen ice pops. ? Gargling with a salt-water mixture 3-4 times a day or as needed. To make a salt-water mixture, add -1 tsp of salt in 1 cup of warm water. Mix it until you cannot see the salt anymore. ? Sucking on hard candy or throat lozenges. ? Putting a cool-mist humidifier in your bedroom at  night. ? Sitting in the bathroom with the door closed for 5-10 minutes while you run hot water in the shower.  Do not use any tobacco products, such as cigarettes, chewing tobacco, and e-cigarettes. If you need help quitting, ask your doctor. Contact a doctor if:  You have a fever for more than 2-3 days.  You keep having symptoms for more than 2-3 days.  Your throat does not get better in 7 days.  You have a fever and your symptoms suddenly get worse. Get help right away if:  You have trouble breathing.  You cannot swallow fluids, soft foods, or your saliva.  You have swelling in your throat or neck that gets worse.  You keep feeling like you are going to throw up (vomit).  You keep throwing up. This information is not intended to replace advice given to you by your health care provider. Make sure you discuss any questions you have with your health care  provider. Document Released: 03/16/2008 Document Revised: 02/01/2016 Document Reviewed: 03/28/2015 Elsevier Interactive Patient Education  2018 [employer]. Allergies An allergy is when your body reacts to a substance in a way that is not normal. An allergic reaction can happen after you:  Eat something.  Breathe in something.  Touch something.  You can be allergic to:  Things that are only around during certain seasons, like molds and pollens.  Foods.  Drugs.  Insects.  Animal dander.  What are the signs or symptoms?  Puffiness (swelling). This may happen on the lips, face, tongue, mouth, or throat.  Sneezing.  Coughing.  Breathing loudly (wheezing).  Stuffy nose.  Tingling in the mouth.  A rash.  Itching.  Itchy, red, puffy areas of skin (hives).  Watery eyes.  Throwing up (vomiting).  Watery poop (diarrhea).  Dizziness.  Feeling faint or fainting.  Trouble breathing or swallowing.  A tight feeling in the chest.  A fast heartbeat. How is this diagnosed? Allergies can be diagnosed  with:  A medical and family history.  Skin tests.  Blood tests.  A food diary. A food diary is a record of all the foods, drinks, and symptoms you have each day.  The results of an elimination diet. This diet involves making sure not to eat certain foods and then seeing what happens when you start eating them again.  How is this treated? There is no cure for allergies, but allergic reactions can be treated with medicine. Severe reactions usually need to be treated at a hospital. How is this prevented? The best way to prevent an allergic reaction is to avoid the thing you are allergic to. Allergy shots and medicines can also help prevent reactions in some cases. This information is not intended to replace advice given to you by your health care provider. Make sure you discuss any questions you have with your health care provider. Document Released: 10/02/2012 Document Revised: 02/02/2016 Document Reviewed: 03/19/2014 Elsevier Interactive Patient Education  2018 [employer]. Allergic Rhinitis, Adult Allergic rhinitis is an allergic reaction that affects the mucous membrane inside the nose. It causes sneezing, a runny or stuffy nose, and the feeling of mucus going down the back of the throat (postnasal drip). Allergic rhinitis can be mild to severe. There are two types of allergic rhinitis:  Seasonal. This type is also called hay fever. It happens only during certain seasons.  Perennial. This type can happen at any time of the year.  What are the causes? This condition happens when the body's defense system (immune system) responds to certain harmless substances called allergens as though they were germs.  Seasonal allergic rhinitis is triggered by pollen, which can come from grasses, trees, and weeds. Perennial allergic rhinitis may be caused by:  House dust mites.  Pet dander.  Mold spores.  What are the signs or symptoms? Symptoms of this condition  include:  Sneezing.  Runny or stuffy nose (nasal congestion).  Postnasal drip.  Itchy nose.  Tearing of the eyes.  Trouble sleeping.  Daytime sleepiness.  How is this diagnosed? This condition may be diagnosed based on:  Your medical history.  A physical exam.  Tests to check for related conditions, such as: ? Asthma. ? Pink eye. ? Ear infection. ? Upper respiratory infection.  Tests to find out which allergens trigger your symptoms. These may include skin or blood tests.  How is this treated? There is no cure for this condition, but treatment can help control symptoms. Treatment  may include:  Taking medicines that block allergy symptoms, such as antihistamines. Medicine may be given as a shot, nasal spray, or pill.  Avoiding the allergen.  Desensitization. This treatment involves getting ongoing shots until your body becomes less sensitive to the allergen. This treatment may be done if other treatments do not help.  If taking medicine and avoiding the allergen does not work, new, stronger medicines may be prescribed.  Follow these instructions at home:  Find out what you are allergic to. Common allergens include smoke, dust, and pollen.  Avoid the things you are allergic to. These are some things you can do to help avoid allergens: ? Replace carpet with wood, tile, or vinyl flooring. Carpet can trap dander and dust. ? Do not smoke. Do not allow smoking in your home. ? Change your heating and air conditioning filter at least once a month. ? During allergy season:  Keep windows closed as much as possible.  Plan outdoor activities when pollen counts are lowest. This is usually during the evening hours.  When coming indoors, change clothing and shower before sitting on furniture or bedding.  Take over-the-counter and prescription medicines only as told by your health care provider.  Keep all follow-up visits as told by your health care provider. This is  important. Contact a health care provider if:  You have a fever.  You develop a persistent cough.  You make whistling sounds when you breathe (you wheeze).  Your symptoms interfere with your normal daily activities. Get help right away if:  You have shortness of breath. Summary  This condition can be managed by taking medicines as directed and avoiding allergens.  Contact your health care provider if you develop a persistent cough or fever.  During allergy season, keep windows closed as much as possible. This information is not intended to replace advice given to you by your health care provider. Make sure you discuss any questions you have with your health care provider. Document Released: 03/02/2001 Document Revised: 07/15/2016 Document Reviewed: 07/15/2016 Elsevier Interactive Patient Education  [employer]. Allergies An allergy is when your body reacts to a substance in a way that is not normal. An allergic reaction can happen after you:  Eat something.  Breathe in something.  Touch something.  You can be allergic to:  Things that are only around during certain seasons, like molds and pollens.  Foods.  Drugs.  Insects.  Animal dander.  What are the signs or symptoms?  Puffiness (swelling). This may happen on the lips, face, tongue, mouth, or throat.  Sneezing.  Coughing.  Breathing loudly (wheezing).  Stuffy nose.  Tingling in the mouth.  A rash.  Itching.  Itchy, red, puffy areas of skin (hives).  Watery eyes.  Throwing up (vomiting).  Watery poop (diarrhea).  Dizziness.  Feeling faint or fainting.  Trouble breathing or swallowing.  A tight feeling in the chest.  A fast heartbeat. How is this diagnosed? Allergies can be diagnosed with:  A medical and family history.  Skin tests.  Blood tests.  A food diary. A food diary is a record of all the foods, drinks, and symptoms you have each day.  The results of an  elimination diet. This diet involves making sure not to eat certain foods and then seeing what happens when you start eating them again.  How is this treated? There is no cure for allergies, but allergic reactions can be treated with medicine. Severe reactions usually need to be treated  at a hospital. How is this prevented? The best way to prevent an allergic reaction is to avoid the thing you are allergic to. Allergy shots and medicines can also help prevent reactions in some cases. This information is not intended to replace advice given to you by your health care provider. Make sure you discuss any questions you have with your health care provider. Document Released: 10/02/2012 Document Revised: 02/02/2016 Document Reviewed: 03/19/2014 Elsevier Interactive Patient Education  [employer].

## 2017-10-06 NOTE — Progress Notes (Addendum)
Subjective:     Patient ID: Yesenia Moore, female   DOB: May 29, 1976, 42 y.o.   MRN: [id number]  HPI  Blood pressure 130/87, pulse 94, temperature 99 F (37.2 C), resp. rate 16, height 5\' 5"  (1.651 m), weight 133 lb (60.3 kg), last menstrual period 09/14/2017, SpO2 98 %, unknown if currently breastfeeding.- not breastfeeding PER PATIENT.  Recheck temperature tympanic 98.4  Allergies  Allergen Reactions  . Lidocaine Other (See Comments)    "fast heart beat, dizziness"     Patient is a 42 year old female in no acute distress who comes to the clinic who was seen in this clinic on  09/29/17 and started Amoxicillin she reports she took smaller dose than prescribed. She reports she took  Cefdinir Dr. Ginette Pitman gave her on 09/27/17 and she did not think it was helping and felt it was a side effect that she was feeling worse and did not like the way the Cefdinir made her feel. Denied any specific symptoms at that time.    She reports she started taking " Amoxicillin 100 mg" and then when provider clarified if she was breaking tablets she says I took Amoxicillin 500 mg from her(  root canal prescription- she never took as instructed per patient for root canal)  she did this two days one pill each day then started the Amoxicillin 875 mg for the past two days she reports. - has taken 3 tablets. This will be her third day on 875 mg Amoxicillin today.  Patient pulls three bottles of antibiotics out of her purse,she has Cefdinir, Amoxicillin 500 mg, and Amoxicillin 875 mg in these bottles. Bottles of Amoxicillin upper  mostly full. She was seen on 09/27/16 and given Amoxicillin 875 mg twice daily and told not to take any of the other antibiotics she might have at home. Patient did not follow instructions with this.   She has hoarse voice and  that started on Sunday 09/29/16. Denies any pain in throat. Able to swallow food and liquids. She reports occasionally having a " lump" in her throat but this is rare. . Denies any  rash. Reports sore throat is better at times than others.   Denies any choking episodes in past or current.  She reports she feels tired. She denies any chest congestion. Denies any sinus pain or pressure.   She reports she has dizziness with Claritin. She reports she has taken Prednisone in past denies reaction but states " she does not like the way she feels when she takes this".   She reports mild fatigue but states " antibiotics usually make me feel this way".  Patient  denies any fever, body aches,chills, rash, chest pain, shortness of breath, nausea, vomiting, or diarrhea.   Denies any smoking history.   Current Outpatient Medications:  .  amoxicillin (AMOXIL) 875 MG tablet, Take 1 tablet (875 mg total) by mouth 2 (two) times daily., Disp: 20 tablet, Rfl: 0 .  levothyroxine (SYNTHROID, LEVOTHROID) 75 MCG tablet, Take by mouth., Disp: , Rfl:   Review of Systems  Constitutional: Positive for fatigue ("mild" still staying busy ). Negative for activity change, appetite change, chills, diaphoresis, fever and unexpected weight change.  HENT: Positive for postnasal drip, rhinorrhea and sore throat. Negative for congestion, dental problem, drooling, ear discharge, ear pain, facial swelling, hearing loss, mouth sores, nosebleeds, sinus pressure, sinus pain, sneezing, tinnitus, trouble swallowing and voice change.   Respiratory: Negative.   Cardiovascular: Negative.   Gastrointestinal: Negative.   Musculoskeletal:  Negative.   Skin: Negative.   Allergic/Immunologic:        -- Lidocaine -- Other (See Comments)   --  "fast heart beat, dizziness"    Neurological: Negative.   Hematological: Negative.   Psychiatric/Behavioral: Negative.        Objective:   Physical Exam  Constitutional: She is oriented to person, place, and time. She appears well-developed and well-nourished. She is active.  Non-toxic appearance. She does not have a sickly appearance. She does not appear ill. No distress.  She is not intubated.  Patient moves on and off of exam table and in room without difficulty. Gait is normal in hall and in room. Patient is oriented to person place time and situation. Patient answers questions appropriately and engages in conversation.  Patient is alert and oriented and responsive to questions Engages in eye contact with provider. Speaks in full sentences without any pauses without any shortness of breath.    HENT:  Head: Normocephalic and atraumatic.  Right Ear: Hearing, tympanic membrane, external ear and ear canal normal.  Left Ear: Hearing, tympanic membrane, external ear and ear canal normal.  Nose: Rhinorrhea present. No mucosal edema. Right sinus exhibits no maxillary sinus tenderness and no frontal sinus tenderness. Left sinus exhibits no maxillary sinus tenderness and no frontal sinus tenderness.  Mouth/Throat: Uvula is midline, oropharynx is clear and moist and mucous membranes are normal. Mucous membranes are not pale, not dry and not cyanotic. No uvula swelling. No oropharyngeal exudate, posterior oropharyngeal edema, posterior oropharyngeal erythema or tonsillar abscesses.  Mild postnasal drip visualized light yellow in color  Cobblestoning posterior pharynx; bilateral allergic shiners; bilateral TMs air fluid level clear; bilateral nasal turbinates no edema mild  erythema clear discharge;    Patient is alert and oriented and responsive to questions Engages in eye contact with provider. Speaks in full sentences without any pauses without any shortness of breath.   Patent airway visualized.  Eyes: Pupils are equal, round, and reactive to light. Conjunctivae and EOM are normal. Right eye exhibits no discharge. Left eye exhibits no discharge. No scleral icterus.  Neck: Trachea normal, normal range of motion, full passive range of motion without pain and phonation normal. Neck supple. Normal carotid pulses, no hepatojugular reflux and no JVD present. No tracheal  tenderness, no spinous process tenderness and no muscular tenderness present. Carotid bruit is not present. No neck rigidity. No tracheal deviation, no edema, no erythema and normal range of motion present. No Brudzinski's sign noted. No thyroid mass and no thyromegaly present.  Cardiovascular: Normal rate, regular rhythm, normal heart sounds and intact distal pulses. Exam reveals no gallop and no friction rub.  No murmur heard. Pulmonary/Chest: Effort normal and breath sounds normal. No accessory muscle usage or stridor. No apnea, no tachypnea and no bradypnea. She is not intubated. No respiratory distress. She has no decreased breath sounds. She has no wheezes. She has no rhonchi. She has no rales. She exhibits no tenderness.  Abdominal: Soft. Bowel sounds are normal.  Musculoskeletal: Normal range of motion.  Patient moves on and off of exam table and in room without difficulty. Gait is normal in hall and in room. Patient is oriented to person place time and situation. Patient answers questions appropriately and engages in conversation.   Lymphadenopathy:       Head (right side): No submental, no submandibular, no tonsillar, no preauricular, no posterior auricular and no occipital adenopathy present.       Head (left side): No submental,  no submandibular, no tonsillar, no preauricular, no posterior auricular and no occipital adenopathy present.    She has no cervical adenopathy.       Right cervical: No superficial cervical, no deep cervical and no posterior cervical adenopathy present.      Left cervical: No superficial cervical, no deep cervical and no posterior cervical adenopathy present.  Neurological: She is alert and oriented to person, place, and time. She displays normal reflexes. No cranial nerve deficit. She exhibits normal muscle tone. Coordination normal.  Skin: Skin is warm and dry. No rash noted. She is not diaphoretic. No erythema. No pallor.  Psychiatric: She has a normal mood and  affect. Her behavior is normal. Judgment and thought content normal.  Vitals reviewed.      Assessment:     Sorethroat - Plan: CBC w/Diff, POCT rapid strep A  Allergic rhinitis, unspecified seasonality, unspecified trigger - Plan: Ambulatory referral to ENT  Noncompliance with medications  Noncompliance by refusing intervention or support       Plan:     Ear infection has resolved completely. Continue Amoxicillin 875 mg twice daily  X 10 days. When provider asked patient to finish  current Amoxicillin patient states " maybe I should not as I kind of remeber having a mild reaction to Amoxicillin years ago". Allergy to Lidocaine only in Epic and patient declined allergy to Amoxicillin at last visit. Provider will add to possible ALLERGY List. Advised to Discontinue Amoxicillin given new information from patient.  No further antibiotics prescribed. She is advised to see PCP today or tomorrow for further evaluation.  Allergies  Allergen Reactions  . Amoxicillin     At end of visit with patient when asked to continue Amoxicillin she reports she may have had a reaction to " this years ago" She does not know what type of reaction. Denies shortness of breath, hives or throat swelling .   Marland Kitchen Lidocaine Other (See Comments)    "fast heart beat, dizziness"     Advised Prednisone- patient declines.  Advised alternative Motrin 600 every 8 hours for pain/ inflammation patient states " my throat does not hurt bad enough for this". Patient declines.    Meds ordered this encounter  Medications  . fluticasone (FLONASE) 50 MCG/ACT nasal spray    Sig: Place 2 sprays into both nostrils daily.    Dispense:  16 g    Refill:  0   Consider trying Zyrtec daily one tablet per package instructions at bed time to minimize side effects.She reports she is unsure if she wants to take Zyrtec at bed time due to possible side effects. She is advised she may take Benadryl at bedtime instead though this is more  sedative - she verbalized understanding however is still suggestive she is unsure if she will take.  Also start Flonase as ordered- patient did not say she would start either with education.   Advised to call her primary care provider today for an appointment on 10/06/17 or by 10/07/17 she reports she would rather be seen at [facility] clinic- she is advised this clinic is not taking new primary care patients and that she has multiple drug intolerances and needs to follow up with PCP as soon as possible.  Medications Discontinued During This Encounter  Medication Reason  .   .   . amoxicillin (AMOXIL) 875 MG tablet Discontinued by provider  ONLY AMOXICILLIN WAS DISCONTINUED.   Orders Placed This Encounter  Procedures  . CBC w/Diff  .  Ambulatory referral to ENT    Referral Priority:   Urgent    Referral Type:   Consultation    Referral Reason:   Specialty Services Required    Referred to Provider:   Clyde Canterbury, MD    Requested Specialty:   Otolaryngology    Number of Visits Requested:   1  . POCT rapid strep A   POCT strep negative in office today - patient requested.   Patient requests referral to ENT- placed as above.   GO TO THE EMERGENCY ROOM IF ANY SYMPTOMS WORSEN AT ANY TIME. CALL 911 for any emergency such as shortness of breath, throat swelling, difficulty breathing, chest pain or any other emergent symptoms FOLLOW UP WITH PRIMARY CARE MD she reports DR. HANDE is her Primary care MD.   Advised patient call the office or your primary care doctor for an appointment if no improvement within 72 hours or if any symptoms change or worsen at any time  Advised ER or urgent Care if after hours or on weekend. Call 911 for emergency symptoms at any time.Patinet verbalized understanding of all instructions given/reviewed and treatment plan and has no further questions or concerns at this time.    Patient verbalized understanding of all instructions given and denies any further questions at this  time.  Provider thoroughly discussed in collaboration above plan with supervising physician Dr. Miguel Aschoff who is in agreement with the care plan as above.

## 2017-10-07 LAB — CBC WITH DIFFERENTIAL/PLATELET
Basophils Absolute: 0 10*3/uL (ref 0.0–0.2)
Basos: 1 %
EOS (ABSOLUTE): 0.2 10*3/uL (ref 0.0–0.4)
Eos: 3 %
HEMOGLOBIN: 13.5 g/dL (ref 11.1–15.9)
Hematocrit: 40.9 % (ref 34.0–46.6)
IMMATURE GRANS (ABS): 0 10*3/uL (ref 0.0–0.1)
IMMATURE GRANULOCYTES: 0 %
LYMPHS: 24 %
Lymphocytes Absolute: 1.9 10*3/uL (ref 0.7–3.1)
MCH: 27.7 pg (ref 26.6–33.0)
MCHC: 33 g/dL (ref 31.5–35.7)
MCV: 84 fL (ref 79–97)
MONOCYTES: 10 %
Monocytes Absolute: 0.7 10*3/uL (ref 0.1–0.9)
NEUTROS ABS: 4.8 10*3/uL (ref 1.4–7.0)
Neutrophils: 62 %
Platelets: 245 10*3/uL (ref 150–379)
RBC: 4.88 x10E6/uL (ref 3.77–5.28)
RDW: 14.7 % (ref 12.3–15.4)
WBC: 7.6 10*3/uL (ref 3.4–10.8)

## 2017-10-07 NOTE — Progress Notes (Signed)
Called and informed CBC normal. She agrees to calling Dr. Ginette Pitman today for evaluation for a office visit today 10/07/17 and will also go to the ER if symptoms worsen over the weekend.  Patient verbalized understanding of all instructions given and denies any further questions at this time.

## 2017-10-07 NOTE — Progress Notes (Signed)
yes

## 2017-10-13 ENCOUNTER — Ambulatory Visit: Payer: BLUE CROSS/BLUE SHIELD | Admitting: Adult Health

## 2017-11-04 DIAGNOSIS — E063 Autoimmune thyroiditis: Secondary | ICD-10-CM | POA: Diagnosis not present

## 2017-11-04 DIAGNOSIS — E038 Other specified hypothyroidism: Secondary | ICD-10-CM | POA: Diagnosis not present

## 2017-11-09 DIAGNOSIS — R5382 Chronic fatigue, unspecified: Secondary | ICD-10-CM | POA: Diagnosis not present

## 2017-11-09 DIAGNOSIS — E611 Iron deficiency: Secondary | ICD-10-CM | POA: Diagnosis not present

## 2017-11-09 DIAGNOSIS — E559 Vitamin D deficiency, unspecified: Secondary | ICD-10-CM | POA: Diagnosis not present

## 2017-11-09 DIAGNOSIS — E038 Other specified hypothyroidism: Secondary | ICD-10-CM | POA: Diagnosis not present

## 2018-02-14 ENCOUNTER — Encounter: Payer: Self-pay | Admitting: Obstetrics and Gynecology

## 2018-02-14 ENCOUNTER — Ambulatory Visit (INDEPENDENT_AMBULATORY_CARE_PROVIDER_SITE_OTHER): Payer: BLUE CROSS/BLUE SHIELD | Admitting: Obstetrics and Gynecology

## 2018-02-14 VITALS — BP 110/78 | HR 87 | Ht 65.0 in | Wt 137.0 lb

## 2018-02-14 DIAGNOSIS — R1084 Generalized abdominal pain: Secondary | ICD-10-CM

## 2018-02-14 DIAGNOSIS — N921 Excessive and frequent menstruation with irregular cycle: Secondary | ICD-10-CM | POA: Diagnosis not present

## 2018-02-14 DIAGNOSIS — E063 Autoimmune thyroiditis: Secondary | ICD-10-CM

## 2018-02-14 DIAGNOSIS — R140 Abdominal distension (gaseous): Secondary | ICD-10-CM | POA: Diagnosis not present

## 2018-02-14 DIAGNOSIS — R5383 Other fatigue: Secondary | ICD-10-CM | POA: Diagnosis not present

## 2018-02-14 NOTE — Progress Notes (Signed)
Obstetrics & Gynecology Office Visit   Chief Complaint  Patient presents with  . Pelvic Pain    irregular periods, twice a month, pain in LQ and has a bloated feeling at all times, fatigue     History of Present Illness: 42 y.o. G15P2002 female who presents for abnormal periods.  She has been having regular periods that come every 25-26 days.  About 2.5 months ago she started having discomfort in her abdomen.  Two to three months ago she started having intermenstrual spotting. She basically notes the spotting about 1 week to 10 days after her period. The spotting lasts for a short amount of time.  She notes it on the tissue.  This normally lasts about 3 days.  She also notes bloating and tender nipples.  She took a pregnancy test about 1 month ago. She was in the [place] for nearly two months and she returned about 2 weeks ago. While there she went to see a gynecologist who could not give her an explanation for her symptoms.  She has some mild nausea.  She feels tired, like she is sick.  She denies dysuria. She denies hematochezia and hematuria. She denies vaginal discharge that is different. She denies vaginal itching, burning, and irritation.  She is not breastfeeding.  She is using condoms for birth control.  She has noted no weight changes. She denies constipation and diarrhea.  She had a TSH in May and the result was 2.406.    Past Medical History:  Diagnosis Date  . Anemia   . Diabetes mellitus without complication (HCC)    Gestational diabetes  . Hashimoto's thyroiditis   . Thyroid disease    Past Surgical History:  Procedure Laterality Date  . CESAREAN SECTION    . CESAREAN SECTION N/A 03/27/2015   Procedure: CESAREAN SECTION;  Surgeon: Will Bonnet, MD;  Location: [facility] ORS;  Service: Obstetrics;  Laterality: N/A;    Gynecologic History: No LMP recorded. (Menstrual status: Irregular Periods).  Obstetric History: G2P2002, s/p c-section x 2  Family history: denies history of  gynecologic cancer  Social History   Socioeconomic History  . Marital status: Married    Spouse name: Not on file  . Number of children: Not on file  . Years of education: Not on file  . Highest education level: Not on file  Occupational History  . Not on file  Social Needs  . Financial resource strain: Not on file  . Food insecurity:    Worry: Not on file    Inability: Not on file  . Transportation needs:    Medical: Not on file    Non-medical: Not on file  Tobacco Use  . Smoking status: Never Smoker  . Smokeless tobacco: Never Used  Substance and Sexual Activity  . Alcohol use: No    Alcohol/week: 0.0 standard drinks  . Drug use: No  . Sexual activity: Yes    Birth control/protection: Condom  Lifestyle  . Physical activity:    Days per week: Not on file    Minutes per session: Not on file  . Stress: Not on file  Relationships  . Social connections:    Talks on phone: Not on file    Gets together: Not on file    Attends religious service: Not on file    Active member of club or organization: Not on file    Attends meetings of clubs or organizations: Not on file    Relationship status: Not on file  .  Intimate partner violence:    Fear of current or ex partner: Not on file    Emotionally abused: Not on file    Physically abused: Not on file    Forced sexual activity: Not on file  Other Topics Concern  . Not on file  Social History Narrative  . Not on file   Allergies  Allergen Reactions  . Amoxicillin     At end of visit with patient when asked to continue Amoxicillin she reports she may have had a reaction to " this years ago" She does not know what type of reaction. Denies shortness of breath, hives or throat swelling .   Marland Kitchen Lidocaine Other (See Comments)    "fast heart beat, dizziness"    Medications   Medication Sig Start Date End Date Taking? Authorizing Provider  levothyroxine (SYNTHROID, LEVOTHROID) 75 MCG tablet Take by mouth. 09/20/16  Yes [provider]    Review of Systems  Constitutional: Positive for malaise/fatigue. Negative for chills, diaphoresis, fever and weight loss.  HENT: Negative.   Eyes: Negative.   Respiratory: Negative.   Cardiovascular: Negative.   Gastrointestinal: Positive for abdominal pain and nausea. Negative for blood in stool, constipation, diarrhea, heartburn, melena and vomiting.  Genitourinary: Negative.   Musculoskeletal: Negative.   Skin: Negative.   Neurological: Negative.   Psychiatric/Behavioral: Negative.      Physical Exam BP 110/78   Pulse 87   Ht 5\' 5"  (1.651 m)   Wt 137 lb (62.1 kg)   Breastfeeding? No   BMI 22.80 kg/m  No LMP recorded. (Menstrual status: Irregular Periods). Physical Exam  Constitutional: She is oriented to person, place, and time. She appears well-developed and well-nourished. No distress.  Genitourinary: Vagina normal. Pelvic exam was performed with patient supine. There is no rash, tenderness or lesion on the right labia. There is no rash, tenderness or lesion on the left labia.  Right adnexum displays tenderness and displays fullness. Right adnexum does not display mass.  Left adnexum displays tenderness. Left adnexum does not display mass and does not display fullness. Cervix does not exhibit motion tenderness.  Genitourinary Comments: Cervix with small amount of red blood oozing. Possible small polypoid lesion, non-tender to palpation. Uterus deviated to patient's right versus adnexal fullness  Eyes: EOM are normal. No scleral icterus.  Neck: Normal range of motion. Neck supple.  Cardiovascular: Normal rate and regular rhythm.  Pulmonary/Chest: Effort normal and breath sounds normal. No respiratory distress. She has no wheezes. She has no rales.  Abdominal: Soft. Bowel sounds are normal. She exhibits no distension and no mass. There is tenderness (mild general ttp diffusely). There is no rebound and no guarding.  Musculoskeletal: Normal range of motion. She  exhibits no edema.  Neurological: She is alert and oriented to person, place, and time. No cranial nerve deficit.  Skin: Skin is warm and dry. No erythema.  Psychiatric: She has a normal mood and affect. Her behavior is normal. Judgment normal.    Female chaperone present for pelvic and breast  portions of the physical exam  Assessment: 42 y.o. G27P2002 female here for  1. Menorrhagia with irregular cycle   2. Generalized abdominal pain   3. Bloating   4. Fatigue, unspecified type   5. Hashimoto's thyroiditis      Plan: Problem List Items Addressed This Visit      Endocrine   Hashimoto's thyroiditis   Relevant Orders   TSH + free T4     Other  Menorrhagia with irregular cycle - Primary   Relevant Orders   Urine Culture   US PELVIS TRANSVANGINAL NON-OB (TV ONLY)    Other Visit Diagnoses    Generalized abdominal pain       Relevant Orders   Urine Culture   US PELVIS TRANSVANGINAL NON-OB (TV ONLY)   Bloating       Fatigue, unspecified type       Relevant Orders   TSH + free T4   VITAMIN D 25 Hydroxy (Vit-D Deficiency, Fractures)   CBC   Comprehensive metabolic panel     Patient presents with non-specific issues today. Will institute workup.  Bleeding source could be small polypoid cervical lesion. She had a negative pap smear with negative HPV 1 year ago.  Will get pelvic ultrasound and check for UTI. For her fatigue, will check some basic labs.  Return in about 1 week (around 02/21/2018) for schedule pelvic ultrasound and follow up Dr. Glennon Mac.   Prentice Docker, MD 02/14/2018 4:29 PM

## 2018-02-15 LAB — COMPREHENSIVE METABOLIC PANEL
A/G RATIO: 2 (ref 1.2–2.2)
ALT: 8 IU/L (ref 0–32)
AST: 13 IU/L (ref 0–40)
Albumin: 4.5 g/dL (ref 3.5–5.5)
Alkaline Phosphatase: 50 IU/L (ref 39–117)
BUN/Creatinine Ratio: 18 (ref 9–23)
BUN: 14 mg/dL (ref 6–20)
CHLORIDE: 102 mmol/L (ref 96–106)
CO2: 20 mmol/L (ref 20–29)
Calcium: 9.9 mg/dL (ref 8.7–10.2)
Creatinine, Ser: 0.76 mg/dL (ref 0.57–1.00)
GFR, EST AFRICAN AMERICAN: 114 mL/min/{1.73_m2} (ref >59)
GFR, EST NON AFRICAN AMERICAN: 99 mL/min/{1.73_m2} (ref >59)
GLOBULIN, TOTAL: 2.3 g/dL (ref 1.5–4.5)
Glucose: 94 mg/dL (ref 65–99)
POTASSIUM: 4.2 mmol/L (ref 3.5–5.2)
SODIUM: 140 mmol/L (ref 134–144)
TOTAL PROTEIN: 6.8 g/dL (ref 6.0–8.5)

## 2018-02-15 LAB — VITAMIN D 25 HYDROXY (VIT D DEFICIENCY, FRACTURES): VIT D 25 HYDROXY: 31.4 ng/mL (ref 30.0–100.0)

## 2018-02-15 LAB — TSH+FREE T4
FREE T4: 1.41 ng/dL (ref 0.82–1.77)
TSH: 1.48 u[IU]/mL (ref 0.450–4.500)

## 2018-02-15 LAB — CBC
HEMATOCRIT: 38.6 % (ref 34.0–46.6)
Hemoglobin: 12.7 g/dL (ref 11.1–15.9)
MCH: 28 pg (ref 26.6–33.0)
MCHC: 32.9 g/dL (ref 31.5–35.7)
MCV: 85 fL (ref 79–97)
PLATELETS: 216 10*3/uL (ref 150–450)
RBC: 4.53 x10E6/uL (ref 3.77–5.28)
RDW: 12.8 % (ref 12.3–15.4)
WBC: 8.2 10*3/uL (ref 3.4–10.8)

## 2018-02-16 LAB — URINE CULTURE

## 2018-02-17 NOTE — Telephone Encounter (Signed)
Pt calling to see if bleeding is dangerous.  Also wants to know why she can't see labs from [place].  [phone] Pt and husband adv as long as she isn't saturating a pad q68m-1hr she is okay.  If she does to go to ED.  Adv Dr. Georgianne Fick looked at her labs and they are normal.  We will know more when u/s done next Thurs. Adv pt she can go about her daily routine as she was thinking it was dangerous for her to.  Adv unable to see labs d/t SDJ hasn't signed off on them yet.

## 2018-02-17 NOTE — Telephone Encounter (Signed)
This encounter was created in error - please disregard.

## 2018-02-22 ENCOUNTER — Ambulatory Visit: Payer: BLUE CROSS/BLUE SHIELD | Admitting: Obstetrics and Gynecology

## 2018-02-23 ENCOUNTER — Ambulatory Visit: Payer: BLUE CROSS/BLUE SHIELD | Admitting: Obstetrics and Gynecology

## 2018-02-23 ENCOUNTER — Ambulatory Visit (INDEPENDENT_AMBULATORY_CARE_PROVIDER_SITE_OTHER): Payer: BLUE CROSS/BLUE SHIELD

## 2018-02-23 ENCOUNTER — Encounter: Payer: Self-pay | Admitting: Obstetrics and Gynecology

## 2018-02-23 VITALS — BP 118/74 | Wt 136.0 lb

## 2018-02-23 DIAGNOSIS — N921 Excessive and frequent menstruation with irregular cycle: Secondary | ICD-10-CM | POA: Diagnosis not present

## 2018-02-23 DIAGNOSIS — R1084 Generalized abdominal pain: Secondary | ICD-10-CM | POA: Diagnosis not present

## 2018-02-23 DIAGNOSIS — N711 Chronic inflammatory disease of uterus: Secondary | ICD-10-CM

## 2018-02-23 MED ORDER — DOXYCYCLINE HYCLATE 100 MG PO CAPS: 100.0000 mg | ORAL_CAPSULE | Freq: Two times a day (BID) | ORAL | 0 refills | Status: AC

## 2018-02-23 NOTE — Progress Notes (Signed)
Gynecology Ultrasound Follow Up   Chief Complaint  Patient presents with  . Follow-up  menorrhagia with irregular menses   History of Present Illness: Patient is a 42 y.o. female who presents today for ultrasound evaluation of the above .  Ultrasound demonstrates the following findings Adnexa: no masses seen  Uterus: anteverted with endometrial stripe  4.9 mm Additional: there is fluid within the endometrial canal measuring 1.8 mm.  Single fibroid measuring 1.8 x 2.3 cm in the right lateral fundal area.   Past Medical History:  Diagnosis Date  . Anemia   . Diabetes mellitus without complication (HCC)    Gestational diabetes  . Hashimoto's thyroiditis   . Thyroid disease     Past Surgical History:  Procedure Laterality Date  . CESAREAN SECTION    . CESAREAN SECTION N/A 03/27/2015   Procedure: CESAREAN SECTION;  Surgeon: Will Bonnet, MD;  Location: [facility] ORS;  Service: Obstetrics;  Laterality: N/A;   Family history: denies gyn cancers   Social History   Socioeconomic History  . Marital status: Married    Spouse name: Not on file  . Number of children: Not on file  . Years of education: Not on file  . Highest education level: Not on file  Occupational History  . Not on file  Social Needs  . Financial resource strain: Not on file  . Food insecurity:    Worry: Not on file    Inability: Not on file  . Transportation needs:    Medical: Not on file    Non-medical: Not on file  Tobacco Use  . Smoking status: Never Smoker  . Smokeless tobacco: Never Used  Substance and Sexual Activity  . Alcohol use: No    Alcohol/week: 0.0 standard drinks  . Drug use: No  . Sexual activity: Yes    Birth control/protection: Condom  Lifestyle  . Physical activity:    Days per week: Not on file    Minutes per session: Not on file  . Stress: Not on file  Relationships  . Social connections:    Talks on phone: Not on file    Gets together: Not on file    Attends  religious service: Not on file    Active member of club or organization: Not on file    Attends meetings of clubs or organizations: Not on file    Relationship status: Not on file  . Intimate partner violence:    Fear of current or ex partner: Not on file    Emotionally abused: Not on file    Physically abused: Not on file    Forced sexual activity: Not on file  Other Topics Concern  . Not on file  Social History Narrative  . Not on file    Allergies  Allergen Reactions  . Amoxicillin     At end of visit with patient when asked to continue Amoxicillin she reports she may have had a reaction to " this years ago" She does not know what type of reaction. Denies shortness of breath, hives or throat swelling .   Marland Kitchen Lidocaine Other (See Comments)    "fast heart beat, dizziness"    Prior to Admission medications   Medication Sig Start Date End Date Taking? Authorizing Provider  fluticasone (FLONASE) 50 MCG/ACT nasal spray Place 2 sprays into both nostrils daily. Patient not taking: Reported on 02/14/2018 10/06/17   Flinchum, Kelby Aline, FNP  levothyroxine (SYNTHROID, LEVOTHROID) 75 MCG tablet Take by mouth. 09/20/16  [provider]    Physical Exam BP 118/74   Wt 136 lb (61.7 kg)   BMI 22.63 kg/m    General: NAD HEENT: normocephalic, anicteric Pulmonary: No increased work of breathing Extremities: no edema, erythema, or tenderness Neurologic: Grossly intact, normal gait Psychiatric: mood appropriate, affect full  US Pelvis Transvanginal Non-ob (tv Only)  Result Date: 02/23/2018 ULTRASOUND REPORT Patient Name: Yesenia Moore DOB: 03-21-1976 MRN: [id number] Location: [facility] Date of Service: 02/23/2018 Indications:Menorrhagia with irregular cycle Findings: The uterus is anteverted and measures 8.19 x 3.47 x 5.24cm. Echo texture is homogenous without evidence of focal masses. Within the uterus is a suspected fibroid measuring:  17.78 x 23.61mm, Rt Lateral Fundal The  Endometrium measures 4.94 mm. - fluid within measuring 1.67mm Right Ovary measures 2.59 x 1.08 x 1.87 cm. It is normal in appearance. Left Ovary measures 2.61 x 1.81 x 1.91 cm. It is normal in appearance. Survey of the adnexa demonstrates no adnexal masses. There is no free fluid in the cul de sac. Impression: 1. Single fibroid measuring 17.78 x 23.63mm, Rt Lateral Fundal 2. Fluid in endometrium measuring 1.8 mm Edwena Bunde, RDMS, RVT The ultrasound images and findings were reviewed by me and I agree with the above report. Prentice Docker, MD, [facility], [facility] 02/23/2018 11:57 AM    Assessment: 42 y.o. G2P2002  1. Chronic endometritis      Plan: Problem List Items Addressed This Visit      Genitourinary   Chronic endometritis - Primary   Relevant Medications   doxycycline (VIBRAMYCIN) 100 MG capsule     No obvious etiology of her symptoms found so far.  Wit the fluid lining her endometrium today, it is possible she has chronic endometritis. Will treat for this. Discussed my thinking behind this. Will assess for resolution of symptoms.  20 minutes spent in face to face discussion with > 50% spent in counseling,management, and coordination of care of her chronic endometritis.   Prentice Docker, MD, [facility], [facility] 02/23/2018 6:02 PM

## 2018-03-24 DIAGNOSIS — R102 Pelvic and perineal pain: Secondary | ICD-10-CM | POA: Diagnosis not present

## 2018-03-24 DIAGNOSIS — R110 Nausea: Secondary | ICD-10-CM | POA: Diagnosis not present

## 2018-03-24 DIAGNOSIS — R399 Unspecified symptoms and signs involving the genitourinary system: Secondary | ICD-10-CM | POA: Diagnosis not present

## 2018-03-24 DIAGNOSIS — N390 Urinary tract infection, site not specified: Secondary | ICD-10-CM | POA: Diagnosis not present

## 2018-03-28 ENCOUNTER — Ambulatory Visit: Payer: BLUE CROSS/BLUE SHIELD | Admitting: Obstetrics and Gynecology

## 2018-04-13 ENCOUNTER — Other Ambulatory Visit (HOSPITAL_COMMUNITY)
Admission: RE | Admit: 2018-04-13 | Discharge: 2018-04-13 | Disposition: A | Discharge status: Home / Self Care | Payer: BLUE CROSS/BLUE SHIELD | Source: Ambulatory Visit | Attending: Obstetrics and Gynecology | Admitting: Obstetrics and Gynecology

## 2018-04-13 ENCOUNTER — Ambulatory Visit (INDEPENDENT_AMBULATORY_CARE_PROVIDER_SITE_OTHER): Payer: BLUE CROSS/BLUE SHIELD | Admitting: Obstetrics and Gynecology

## 2018-04-13 ENCOUNTER — Encounter: Payer: Self-pay | Admitting: Obstetrics and Gynecology

## 2018-04-13 VITALS — BP 112/80 | Ht 65.0 in | Wt 141.0 lb

## 2018-04-13 DIAGNOSIS — R350 Frequency of micturition: Secondary | ICD-10-CM

## 2018-04-13 DIAGNOSIS — Z124 Encounter for screening for malignant neoplasm of cervix: Secondary | ICD-10-CM | POA: Insufficient documentation

## 2018-04-13 DIAGNOSIS — R3989 Other symptoms and signs involving the genitourinary system: Secondary | ICD-10-CM

## 2018-04-13 NOTE — Patient Instructions (Addendum)
Interstitial Cystitis Interstitial cystitis is a condition that causes inflammation of the bladder. The bladder is a hollow organ in the lower part of your abdomen. It stores urine after the urine is made by your kidneys. With interstitial cystitis, you may have pain in the bladder area. You may also have a frequent and urgent need to urinate. The severity of interstitial cystitis can vary from person to person. You may have flare-ups of the condition, and then it may go away for a while. For many people who have this condition, it becomes a long-term problem. What are the causes? The cause of this condition is not known. What increases the risk? This condition is more likely to develop in women. What are the signs or symptoms? Symptoms of interstitial cystitis vary, and they can change over time. Symptoms may include:  Discomfort or pain in the bladder area. This can range from mild to severe. The pain may change in intensity as the bladder fills with urine or as it empties.  Pelvic pain.  An urgent need to urinate.  Frequent urination.  Pain during sexual intercourse.  Pinpoint bleeding on the bladder wall.  For women, the symptoms often get worse during menstruation. How is this diagnosed? This condition is diagnosed by evaluating your symptoms and ruling out other causes. A physical exam will be done. Various tests may be done to rule out other conditions. Common tests include:  Urine tests.  Cystoscopy. In this test, a tool that is like a very thin telescope is used to look into your bladder.  Biopsy. This involves taking a sample of tissue from the bladder wall to be examined under a microscope.  How is this treated? There is no cure for interstitial cystitis, but treatment methods are available to control your symptoms. Work closely with your health care provider to find the treatments that will be most effective for you. Treatment options may include:  Medicines to relieve  pain and to help reduce the number of times that you feel the need to urinate.  Bladder training. This involves learning ways to control when you urinate, such as: ? Urinating at scheduled times. ? Training yourself to delay urination. ? Doing exercises (Kegel exercises) to strengthen the muscles that control urine flow.  Lifestyle changes, such as changing your diet or taking steps to control stress.  Use of a device that provides electrical stimulation in order to reduce pain.  A procedure that stretches your bladder by filling it with air or fluid.  Surgery. This is rare. It is only done for extreme cases if other treatments do not help.  Follow these instructions at home:  Take medicines only as directed by your health care provider.  Use bladder training techniques as directed. ? Keep a bladder diary to find out which foods, liquids, or activities make your symptoms worse. ? Use your bladder diary to schedule bathroom trips. If you are away from home, plan to be near a bathroom at each of your scheduled times. ? Make sure you urinate just before you leave the house and just before you go to bed.  Do Kegel exercises as directed by your health care provider.  Do not drink alcohol.  Do not use any tobacco products, including cigarettes, chewing tobacco, or electronic cigarettes. If you need help quitting, ask your health care provider.  Make dietary changes as directed by your health care provider. You may need to avoid spicy foods and foods that contain a high amount  of potassium.  Limit your drinking of beverages that stimulate urination. These include soda, coffee, and tea.  Keep all follow-up visits as directed by your health care provider. This is important. Contact a health care provider if:  Your symptoms do not get better after treatment.  Your pain and discomfort are getting worse.  You have more frequent urges to urinate.  You have a fever. Get help right away  if:  You are not able to control your bladder at all. This information is not intended to replace advice given to you by your health care provider. Make sure you discuss any questions you have with your health care provider. Document Released: 02/06/2004 Document Revised: 11/13/2015 Document Reviewed: 02/12/2014 Elsevier Interactive Patient Education  2018 [place] [employer] Bananas, blueberries, melons, pears, Apples ([place], [place], [place])  Avoid Lemons, limes, oranges, grapefruit, strawberries, pineapple, kiwi fruit Vegetables IC Friendly Asparagus, avocado, celery, black olives, cucumber, green beans, bell peppers, beans (black eyed peas, garbanzo, white, pinto) Avoid Chili peppers, onions, sauerkraut, tomato products, pickles Milk/Dairy  IC Friendly Cheeses - (American, mozzarella, cheddar cheese (mild), feta, ricotta, string cheeses) cream cheese, eggs, milk, sherbet (no citrus or chocolate flavors) Avoid Chocolate ice cream, processed cheeses, soy products, yogurt (lemon, lime, orange or chocolate) Beverages IC Friendly Water, blueberry or pear juice, milk (whole, low-fat, nonfat, lactaid, rice, goal almond), tea (chamomile or peppermint), vanilla milkshake Avoid Alcohol, chocolate milkshake, coffee, cranberry juice, soda, tea (regular, green, herbal, iced) tomato juice, sports drinks If you need help selecting bladder-friendly foods, there's an app for that. [employer], available for Android and iPhone, provides a reference guide you can use while shopping and eating out. The app contains a database with foods divided into three categories: bladder-friendly foods, foods worth trying cautiously and foods to avoid.

## 2018-04-13 NOTE — Progress Notes (Signed)
     Patient ID: Yesenia Moore, female   DOB: [birth year], 42 y.o.   MRN: [id number]  Reason for Consult: Abdominal Pain and Urinary Frequency   Referred by No ref. provider found  Subjective:     HPI:  Yesenia Moore is a 42 y.o. female . She reports bladder pain frequent urination Pain after intercourse. No pain during intercourse.  She does report drinking a lot of coffee  Gynecological History  Patient's last menstrual period was 04/03/2018.   History of fibroids, polyps, or ovarian cysts? : no  History of PCOS? no Hstory of Endometriosis? no History of abnormal pap smears? no Have you had any sexually transmitted infections in the past? no  Past Medical History:  Diagnosis Date  . Anemia   . Diabetes mellitus without complication (HCC)    Gestational diabetes  . Hashimoto's thyroiditis   . Thyroid disease    History reviewed. No pertinent family history. Past Surgical History:  Procedure Laterality Date  . CESAREAN SECTION    . CESAREAN SECTION N/A 03/27/2015   Procedure: CESAREAN SECTION;  Surgeon: Will Bonnet, MD;  Location: [facility] ORS;  Service: Obstetrics;  Laterality: N/A;  . CESAREAN SECTION      Short Social History:  Social History   Tobacco Use  . Smoking status: Never Smoker  . Smokeless tobacco: Never Used  Substance Use Topics  . Alcohol use: Yes    Alcohol/week: 2.0 standard drinks    Types: 2 Glasses of wine per week    Comment: occasional    Allergies  Allergen Reactions  . Amoxicillin     At end of visit with patient when asked to continue Amoxicillin she reports she may have had a reaction to " this years ago" She does not know what type of reaction. Denies shortness of breath, hives or throat swelling .   Marland Kitchen Lidocaine Other (See Comments)    "fast heart beat, dizziness"    Current Outpatient Medications  Medication Sig Dispense Refill  . hydrOXYzine (ATARAX/VISTARIL) 25 MG tablet Take 1 tablet (25 mg total) by mouth 3 (three) times  daily as needed for anxiety. 20 tablet 0  . meclizine (ANTIVERT) 12.5 MG tablet Take 1 tablet (12.5 mg total) by mouth 3 (three) times daily as needed for dizziness. 30 tablet 0  . ondansetron (ZOFRAN ODT) 4 MG disintegrating tablet Take 1 tablet (4 mg total) by mouth every 8 (eight) hours as needed for nausea or vomiting. 20 tablet 0  . SYNTHROID 50 MCG tablet TAKE 1 TABLET (50 MCG TOTAL) BY MOUTH EVERY OTHER DAY. ALTERNATIVE DAYS WITH 75 MCG DOSE 30 tablet 1   No current facility-administered medications for this visit.    REVIEW OF SYSTEMS      Objective:  Objective   Vitals:   04/13/18 1453  BP: 112/80  Weight: 141 lb (64 kg)  Height: 5\' 5"  (1.651 m)   Body mass index is 23.46 kg/m.  Physical Exam  Assessment/Plan:     42 yo with bladder pain Discussed IC diet Referral to urology for further evaluation Pap smear performed today   Adrian Prows MD [place] 10/11/2020 6:40 PM

## 2018-04-13 NOTE — Progress Notes (Signed)
141lb

## 2018-04-17 ENCOUNTER — Other Ambulatory Visit: Payer: Self-pay

## 2018-04-17 ENCOUNTER — Emergency Department: Payer: BLUE CROSS/BLUE SHIELD

## 2018-04-17 ENCOUNTER — Encounter: Payer: Self-pay | Admitting: Emergency Medicine

## 2018-04-17 ENCOUNTER — Emergency Department
Admission: EM | Admit: 2018-04-17 | Discharge: 2018-04-17 | Disposition: A | Discharge status: Home / Self Care | Payer: BLUE CROSS/BLUE SHIELD | Attending: Emergency Medicine | Admitting: Emergency Medicine

## 2018-04-17 DIAGNOSIS — E063 Autoimmune thyroiditis: Secondary | ICD-10-CM | POA: Diagnosis not present

## 2018-04-17 DIAGNOSIS — Z79899 Other long term (current) drug therapy: Secondary | ICD-10-CM | POA: Diagnosis not present

## 2018-04-17 DIAGNOSIS — E119 Type 2 diabetes mellitus without complications: Secondary | ICD-10-CM | POA: Diagnosis not present

## 2018-04-17 DIAGNOSIS — R1030 Lower abdominal pain, unspecified: Secondary | ICD-10-CM | POA: Diagnosis not present

## 2018-04-17 DIAGNOSIS — D259 Leiomyoma of uterus, unspecified: Secondary | ICD-10-CM | POA: Diagnosis not present

## 2018-04-17 LAB — COMPREHENSIVE METABOLIC PANEL
ALT: 13 U/L (ref 0–44)
ANION GAP: 10 (ref 5–15)
AST: 16 U/L (ref 15–41)
Albumin: 4.4 g/dL (ref 3.5–5.0)
Alkaline Phosphatase: 45 U/L (ref 38–126)
BUN: 12 mg/dL (ref 6–20)
CHLORIDE: 103 mmol/L (ref 98–111)
CO2: 26 mmol/L (ref 22–32)
Calcium: 9.5 mg/dL (ref 8.9–10.3)
Creatinine, Ser: 0.71 mg/dL (ref 0.44–1.00)
Glucose, Bld: 99 mg/dL (ref 70–99)
POTASSIUM: 3.9 mmol/L (ref 3.5–5.1)
Sodium: 139 mmol/L (ref 135–145)
Total Bilirubin: 0.8 mg/dL (ref 0.3–1.2)
Total Protein: 7.5 g/dL (ref 6.5–8.1)

## 2018-04-17 LAB — CBC
HEMATOCRIT: 40.1 % (ref 36.0–46.0)
HEMOGLOBIN: 13.1 g/dL (ref 12.0–15.0)
MCH: 27.8 pg (ref 26.0–34.0)
MCHC: 32.7 g/dL (ref 30.0–36.0)
MCV: 85.1 fL (ref 80.0–100.0)
NRBC: 0 % (ref 0.0–0.2)
Platelets: 216 10*3/uL (ref 150–400)
RBC: 4.71 MIL/uL (ref 3.87–5.11)
RDW: 12.6 % (ref 11.5–15.5)
WBC: 8.4 10*3/uL (ref 4.0–10.5)

## 2018-04-17 LAB — POCT PREGNANCY, URINE: Preg Test, Ur: NEGATIVE

## 2018-04-17 LAB — URINALYSIS, COMPLETE (UACMP) WITH MICROSCOPIC
BACTERIA UA: NONE SEEN
Bilirubin Urine: NEGATIVE
GLUCOSE, UA: NEGATIVE mg/dL
Hgb urine dipstick: NEGATIVE
KETONES UR: 5 mg/dL — AB
LEUKOCYTES UA: NEGATIVE
NITRITE: NEGATIVE
PROTEIN: NEGATIVE mg/dL
Specific Gravity, Urine: 1.006 (ref 1.005–1.030)
pH: 6 (ref 5.0–8.0)

## 2018-04-17 LAB — LIPASE, BLOOD: Lipase: 37 U/L (ref 11–51)

## 2018-04-17 MED ORDER — DICYCLOMINE HCL 10 MG PO CAPS
20.0000 mg | ORAL_CAPSULE | Freq: Once | ORAL | Status: AC
  Administered 2018-04-17: 20 mg via ORAL
  Filled 2018-04-17: qty 2

## 2018-04-17 MED ORDER — ONDANSETRON 4 MG PO TBDP: 4.0000 mg | ORAL_TABLET | Freq: Once | ORAL | Status: DC | PRN

## 2018-04-17 NOTE — ED Notes (Signed)

## 2018-04-17 NOTE — ED Provider Notes (Signed)
[facility] Emergency Department Provider Note  ____________________________________________   I have reviewed the triage vital signs and the nursing notes.   HISTORY  Chief Complaint Abdominal Pain   History limited by: Not Limited   HPI Yesenia Moore is a 42 y.o. female who presents to the emergency department today because of concerns for lower abdominal pain.  Has been present for the past 6 months.  It does come and go.  She states it does become severe.  She states she feels like it is gradually getting worse.  She also complains of pain all over.  She has been treated for cystitis 2 times for this without any significant relief.  Patient states that she has been to OB/GYN where they performed an ultrasound which showed a fibroid and some fluid in the endometrium.  Per medical record review patient has a history of DM, anemia, chronic endometritis.   Past Medical History:  Diagnosis Date  . Anemia   . Diabetes mellitus without complication (HCC)    Gestational diabetes  . Hashimoto's thyroiditis   . Thyroid disease     Patient Active Problem List   Diagnosis Date Noted  . Chronic endometritis 02/23/2018  . Menorrhagia with irregular cycle 02/14/2018  . Noncompliance with medications 10/06/2017  . Noncompliance by refusing intervention or support 10/06/2017  . Allergic rhinitis 10/06/2017  . Sorethroat 10/06/2017  . Proteinuria 12/21/2016  . S/P cesarean section 03/27/2015  . Hashimoto's thyroiditis 05/12/2014    Past Surgical History:  Procedure Laterality Date  . CESAREAN SECTION    . CESAREAN SECTION N/A 03/27/2015   Procedure: CESAREAN SECTION;  Surgeon: Will Bonnet, MD;  Location: [facility] ORS;  Service: Obstetrics;  Laterality: N/A;    Prior to Admission medications   Medication Sig Start Date End Date Taking? Authorizing Provider  fluticasone (FLONASE) 50 MCG/ACT nasal spray Place 2 sprays into both nostrils daily. Patient not  taking: Reported on 02/14/2018 10/06/17   Flinchum, Kelby Aline, FNP  levothyroxine (SYNTHROID, LEVOTHROID) 75 MCG tablet Take by mouth. 09/20/16   [provider]    Allergies Amoxicillin and Lidocaine  History reviewed. No pertinent family history.  Social History Social History   Tobacco Use  . Smoking status: Never Smoker  . Smokeless tobacco: Never Used  Substance Use Topics  . Alcohol use: No    Alcohol/week: 0.0 standard drinks    Comment: occasional  . Drug use: No    Review of Systems Constitutional: No fever/chills Eyes: No visual changes. ENT: No sore throat. Cardiovascular: Denies chest pain. Respiratory: Denies shortness of breath. Gastrointestinal: Positive for lower abdominal pain. Genitourinary: Negative for dysuria. Musculoskeletal: Negative for back pain. Skin: Negative for rash. Neurological: Negative for headaches, focal weakness or numbness.  ____________________________________________   PHYSICAL EXAM:  VITAL SIGNS: ED Triage Vitals  Enc Vitals Group     BP 04/17/18 1607 (!) 142/100     Pulse Rate 04/17/18 1607 92     Resp 04/17/18 1607 16     Temp 04/17/18 1607 98.8 F (37.1 C)     Temp src --      SpO2 04/17/18 1607 100 %     Weight 04/17/18 1608 140 lb 15.8 oz (64 kg)     Height 04/17/18 1608 5\' 5"  (1.651 m)     Head Circumference --      Peak Flow --      Pain Score 04/17/18 1607 8   Constitutional: Alert and oriented.  Eyes: Conjunctivae  are normal.  ENT      Head: Normocephalic and atraumatic.      Nose: No congestion/rhinnorhea.      Mouth/Throat: Mucous membranes are moist.      Neck: No stridor. Hematological/Lymphatic/Immunilogical: No cervical lymphadenopathy. Cardiovascular: Normal rate, regular rhythm.  No murmurs, rubs, or gallops.  Respiratory: Normal respiratory effort without tachypnea nor retractions. Breath sounds are clear and equal bilaterally. No wheezes/rales/rhonchi. Gastrointestinal: Soft and minimally  tender in the lower abdomen. No rebound. No guarding.  Genitourinary: Deferred Musculoskeletal: Normal range of motion in all extremities. No lower extremity edema. Neurologic:  Normal speech and language. No gross focal neurologic deficits are appreciated.  Skin:  Skin is warm, dry and intact. No rash noted. Psychiatric: Mood and affect are normal. Speech and behavior are normal. Patient exhibits appropriate insight and judgment.  ____________________________________________    LABS (pertinent positives/negatives)  CMP wnl Upreg negative CBC wnl Lipase 37 UA leukocytes negative, wbc 0-5  ____________________________________________   EKG  None  ____________________________________________    RADIOLOGY  US transvaginal Fibroids ____________________________________________   PROCEDURES  Procedures  ____________________________________________   INITIAL IMPRESSION / ASSESSMENT AND PLAN / ED COURSE  Pertinent labs & imaging results that were available during my care of the patient were reviewed by me and considered in my medical decision making (see chart for details).   Patient presented to the emergency department today because of concerns for continued abdominal pain.  Patient states his symptoms have been going on for roughly half a year.  She has been seen by OB/GYN as well as primary care for this.  On exam she has some mild tenderness to the lower abdomen.  Blood work without any leukocytosis patient is afebrile.  Had a discussion with the patient.  At this point given chronic nature of pain unclear if will be able to find the etiology.  Did discuss with patient importance of continued follow-up with specialist.  She does have a history of chronic endometritis per OB/GYN note and this could be the etiology of the pain.  I do doubt appendicitis or other significant intra-abdominal infection.  Will repeat transvaginal ultrasound to evaluate for change in fluid last  seen in the endometrium.    Ultrasound today shows some fibroids but no other concerning findings.  Discussed this with patient.  Discussed patient importance of follow-up with specialist.  ____________________________________________   FINAL CLINICAL IMPRESSION(S) / ED DIAGNOSES  Final diagnoses:  Lower abdominal pain     Note: This dictation was prepared with Dragon dictation. Any transcriptional errors that result from this process are unintentional     Nance Pear, MD 04/18/18 1656

## 2018-04-17 NOTE — ED Notes (Signed)
Patient transported to Ultrasound 

## 2018-04-17 NOTE — Discharge Instructions (Addendum)
Please seek medical attention for any high fevers, chest pain, shortness of breath, change in behavior, persistent vomiting, bloody stool or any other new or concerning symptoms.  Procedures - none

## 2018-04-17 NOTE — ED Notes (Addendum)
Pt refusing medication at this time before [place]. MD aware

## 2018-04-17 NOTE — ED Triage Notes (Signed)
Pt presents with lower abdominal pain x 1 month; she was treated at [facility] for uti and was put on antibiotic and then again at gynecologist for the same. Denies painful urination, endorses frequency. She states pain is more on right side and that she has bloating/"I feel full all the time."  Pt alert & oriented with NAD  Noted.

## 2018-04-20 ENCOUNTER — Ambulatory Visit (INDEPENDENT_AMBULATORY_CARE_PROVIDER_SITE_OTHER): Payer: BLUE CROSS/BLUE SHIELD | Admitting: Urology

## 2018-04-20 ENCOUNTER — Encounter: Payer: Self-pay | Admitting: Urology

## 2018-04-20 VITALS — BP 125/78 | HR 102 | Ht 65.0 in | Wt 139.7 lb

## 2018-04-20 DIAGNOSIS — R3989 Other symptoms and signs involving the genitourinary system: Secondary | ICD-10-CM

## 2018-04-20 DIAGNOSIS — R109 Unspecified abdominal pain: Secondary | ICD-10-CM

## 2018-04-20 DIAGNOSIS — R350 Frequency of micturition: Secondary | ICD-10-CM | POA: Diagnosis not present

## 2018-04-20 LAB — URINALYSIS, COMPLETE
BILIRUBIN UA: NEGATIVE
Glucose, UA: NEGATIVE
Leukocytes, UA: NEGATIVE
NITRITE UA: NEGATIVE
PH UA: 5.5 (ref 5.0–7.5)
PROTEIN UA: NEGATIVE
RBC UA: NEGATIVE
Specific Gravity, UA: 1.025 (ref 1.005–1.030)
UUROB: 0.2 mg/dL (ref 0.2–1.0)

## 2018-04-20 LAB — MICROSCOPIC EXAMINATION
RBC, UA: NONE SEEN /hpf (ref 0–2)
WBC UA: NONE SEEN /HPF (ref 0–5)

## 2018-04-20 LAB — CYTOLOGY - PAP
DIAGNOSIS: NEGATIVE
HPV: NOT DETECTED

## 2018-04-20 LAB — BLADDER SCAN AMB NON-IMAGING: SCAN RESULT: 0

## 2018-04-20 MED ORDER — PHENAZOPYRIDINE HCL 200 MG PO TABS: 200.0000 mg | ORAL_TABLET | Freq: Three times a day (TID) | ORAL | 0 refills | Status: DC | PRN

## 2018-04-20 NOTE — Progress Notes (Signed)
04/20/2018 8:31 PM   Yesenia Moore Nov 16, 1975 [id number]  Referring provider: Homero Moore, [address]. [place], [place] [address]  No chief complaint on file.   HPI: Patient is a 42 year old [place] female who is referred by Dr. Homero Moore for urinary frequency and bladder pain.  She states that she has been having pain in her lower belly and lower back for over a month.  She also states that with this pain she experiences a bilateral leg numbness down the inside of her legs.  She is having associated frequency.  She states that while she is on the antibiotic that the symptoms abate somewhat, but they returned once the antibiotics are completed.  Patient denies any gross hematuria, dysuria or suprapubic/flank pain.  Patient denies any fevers, chills, nausea or vomiting.   Her UA today is positive for moderate bacteria.  Her PVR is 0 mL.    She had a negative urine culture in August 2019 and a positive urine culture on March 24, 2018 for Enterococcus faecalis.    She is also sought treatment with gynecology in September 2019 and treated for endometritis.  She has a follow up with them next week.    She also sought treatment in the ED on 04/17/2018.  Pelvic ultrasound with them noted fibroids.    She does not have a history of nephrolithiasis.   PMH: Past Medical History:  Diagnosis Date  . Anemia   . Diabetes mellitus without complication (HCC)    Gestational diabetes  . Hashimoto's thyroiditis   . Thyroid disease     Surgical History: Past Surgical History:  Procedure Laterality Date  . CESAREAN SECTION    . CESAREAN SECTION N/A 03/27/2015   Procedure: CESAREAN SECTION;  Surgeon: Yesenia Bonnet, MD;  Location: [facility] ORS;  Service: Obstetrics;  Laterality: N/A;  . CESAREAN SECTION      Home Medications:  Allergies as of 04/20/2018      Reactions   Amoxicillin    At end of visit with patient when asked to continue Amoxicillin she  reports she may have had a reaction to " this years ago" She does not know what type of reaction. Denies shortness of breath, hives or throat swelling .    Lidocaine Other (See Comments)   "fast heart beat, dizziness"      Medication List        Accurate as of 04/20/18 11:59 PM. Always use your most recent med list.          levothyroxine 75 MCG tablet Commonly known as:  SYNTHROID, LEVOTHROID Take by mouth.   phenazopyridine 200 MG tablet Commonly known as:  PYRIDIUM Take 1 tablet (200 mg total) by mouth 3 (three) times daily as needed for pain.       Allergies:  Allergies  Allergen Reactions  . Amoxicillin     At end of visit with patient when asked to continue Amoxicillin she reports she may have had a reaction to " this years ago" She does not know what type of reaction. Denies shortness of breath, hives or throat swelling .   Marland Kitchen Lidocaine Other (See Comments)    "fast heart beat, dizziness"    Family History: No family history on file.  Social History:  reports that she has never smoked. She has never used smokeless tobacco. She reports that she drinks about 2.0 standard drinks of alcohol per week. She reports that she does not use drugs.  ROS:  UROLOGY Frequent Urination?: Yes Hard to postpone urination?: No Burning/pain with urination?: No Get up at night to urinate?: No Leakage of urine?: Yes Urine stream starts and stops?: No Trouble starting stream?: No Do you have to strain to urinate?: No Blood in urine?: No Urinary tract infection?: No Sexually transmitted disease?: No Injury to kidneys or bladder?: No Painful intercourse?: No Weak stream?: No Currently pregnant?: No Vaginal bleeding?: No Last menstrual period?: n  Gastrointestinal Nausea?: Yes Vomiting?: No Indigestion/heartburn?: No Diarrhea?: No Constipation?: No  Constitutional Fever: No Night sweats?: No Weight loss?: No Fatigue?: No  Skin Skin rash/lesions?: No Itching?:  No  Eyes Blurred vision?: No Double vision?: No  Ears/Nose/Throat Sore throat?: Yes Sinus problems?: No  Hematologic/Lymphatic Swollen glands?: No Easy bruising?: No  Cardiovascular Leg swelling?: Yes Chest pain?: No  Respiratory Cough?: No Shortness of breath?: No  Endocrine Excessive thirst?: No  Musculoskeletal Back pain?: Yes Joint pain?: Yes  Neurological Headaches?: No Dizziness?: No  Psychologic Depression?: No Anxiety?: No  Physical Exam: BP 125/78 (BP Location: Left Arm, Patient Position: Sitting, Cuff Size: Normal)   Pulse (!) 102   Ht 5\' 5"  (1.651 m)   Wt 139 lb 11.2 oz (63.4 kg)   LMP 04/06/2018 (Exact Date)   BMI 23.25 kg/m   Constitutional:  Well nourished. Alert and oriented, No acute distress. HEENT: [place] AT, moist mucus membranes.  Trachea midline, no masses. Cardiovascular: No clubbing, cyanosis, or edema. Respiratory: Normal respiratory effort, no increased work of breathing. Skin: No rashes, bruises or suspicious lesions. Lymph: No cervical or inguinal adenopathy. Neurologic: Grossly intact, no focal deficits, moving all 4 extremities. Psychiatric: Normal mood and affect.  Laboratory Data: Lab Results  Component Value Date   WBC 8.4 04/17/2018   HGB 13.1 04/17/2018   HCT 40.1 04/17/2018   MCV 85.1 04/17/2018   PLT 216 04/17/2018    Lab Results  Component Value Date   CREATININE 0.71 04/17/2018    No results found for: PSA  No results found for: TESTOSTERONE  Lab Results  Component Value Date   HGBA1C 5.5 12/01/2016    Lab Results  Component Value Date   TSH 1.480 02/14/2018    No results found for: CHOL, HDL, CHOLHDL, VLDL, LDLCALC  Lab Results  Component Value Date   AST 16 04/17/2018   Lab Results  Component Value Date   ALT 13 04/17/2018   No components found for: ALKALINEPHOPHATASE No components found for: BILIRUBINTOTAL  No results found for: ESTRADIOL  Urinalysis Moderate bacteria.  See Epic. I  have reviewed the labs.   Pertinent Imaging: Results for Yesenia, Moore (MRN [id number]) as of 04/23/2018 20:26  Ref. Range 04/20/2018 15:45  Scan Result Unknown 0    Assessment & Plan:    1. Bladder pain Explained to the patient that there may be a small chance her symptoms are caused by a distal ureteral stone.  We discussed undergoing a CT renal stone study versus a renal ultrasound.  She would like to undergo the renal ultrasound to avoid any exposure to radiation from a CT scan.  She understands that if the kidneys are found to have hydronephrosis on the renal ultrasound we Yesenia need to pursue a CT scan for further evaluation.  She understands this and we Yesenia pursue the renal ultrasound  - Urinalysis, Complete - Bladder Scan (Post Void Residual) in office I have prescribed Pyridium for her bladder discomfort.    2. Frequency RUS is pending If RUS is negative  for hydronephrosis, may to consider scheduling a cystoscopy for further evaluation  Return for I Yesenia call patient with results.  These notes generated with voice recognition software. I apologize for typographical errors.  Zara Council, PA-C  [facility] [address]  [place], [place] [address] [phone]

## 2018-04-23 ENCOUNTER — Telehealth: Payer: Self-pay | Admitting: Urology

## 2018-04-23 NOTE — Telephone Encounter (Signed)
Would you check on the status of her renal ultrasound?

## 2018-04-24 NOTE — Telephone Encounter (Signed)
See message below, I failed to send the message to you.   Sharyn Lull

## 2018-04-24 NOTE — Telephone Encounter (Signed)
They keep calling her but the first time her VM was not set up and the next time they left a message   Sharyn Lull

## 2018-04-25 ENCOUNTER — Encounter: Payer: Self-pay | Admitting: Obstetrics and Gynecology

## 2018-04-25 ENCOUNTER — Ambulatory Visit (INDEPENDENT_AMBULATORY_CARE_PROVIDER_SITE_OTHER): Payer: BLUE CROSS/BLUE SHIELD | Admitting: Obstetrics and Gynecology

## 2018-04-25 VITALS — BP 122/74 | Ht 60.0 in | Wt 142.0 lb

## 2018-04-25 DIAGNOSIS — M545 Low back pain: Secondary | ICD-10-CM

## 2018-04-25 DIAGNOSIS — G8929 Other chronic pain: Secondary | ICD-10-CM | POA: Diagnosis not present

## 2018-04-25 DIAGNOSIS — R1031 Right lower quadrant pain: Secondary | ICD-10-CM

## 2018-04-25 DIAGNOSIS — R350 Frequency of micturition: Secondary | ICD-10-CM

## 2018-04-25 NOTE — Progress Notes (Signed)
Obstetrics & Gynecology Office Visit   Chief Complaint  Patient presents with  . Follow-up  Multiple issues, including abdominal pain.   History of Present Illness: 42 y.o. G8P2002 female who presents for multiple issues today.  These include ongoing lower abdominal pain.  She states her pain is in her lower abdomen, right lower back. She also has right anterior leg pain and numbness.  She was also feeling like she had to urinate.  She has also had nausea and occasional dizzyness.  She also states that occasionally she has nausea which causes her to vomit.  These episodes happen quite frequently.  She took an antibiotic for initially from a walk-in clinic, which relieved her symptoms of abdominal pain for a few days. When she finished the antibiotic her pain came back after a few days.  She has seen me several times, Dr. Gilman Schmidt (here at [facility]), the ER, and a urologist.   She is going to get an ultrasound on 11/12 that has been ordered by the urologist.   She had cystitis on 03/24/18 (e. Faecalis - treated with ciprofloxacin, which showed as being effective against the bacteria on the urine culture).   She denies diarrhea, constipation, hematochezia, melena.  She denies hematuria.   Her LMP was 10/17, it lasted 2 days and brown discharge a couple days before and several days after. In total she had bleeding for 1 week.  Her menses are coming regularly.  Her history is a bit scattered and difficult to follow.  However, she has had abdominal pain for some time of an unknown origin and has had a work-up by me, as well as the emergency room, which did not yield a particular finding.  She was noted to have fibroids by me on the pelvic ultrasound.  She was also noted to have fibroids on a hospital ultrasound in the emergency room.  These fibroids measured less than 2 cm each.  Her urologist has ordered a renal ultrasound to assess for nephro ureterolithiasis.  She has also had some evaluation in the  [place], [place].  She states that she has been told that she has a small slipped disc.  She was prescribed Pyridium by the urologist, which she has not taken.  She believes that this was the same medication prescribed in the ER, which did not work.  I looked up which medication she was prescribed and she was prescribed dicyclomine.  I explained that these are different medications and that if she gained some relief from Pyridium, this may be an indicator of the location source of her pain.   Past Medical History:  Diagnosis Date  . Anemia   . Diabetes mellitus without complication (HCC)    Gestational diabetes  . Hashimoto's thyroiditis   . Thyroid disease     Past Surgical History:  Procedure Laterality Date  . CESAREAN SECTION    . CESAREAN SECTION N/A 03/27/2015   Procedure: CESAREAN SECTION;  Surgeon: Will Bonnet, MD;  Location: [facility] ORS;  Service: Obstetrics;  Laterality: N/A;  . CESAREAN SECTION      Gynecologic History: Patient's last menstrual period was 04/06/2018 (exact date).  Obstetric History: [id number]  Family History: denies gynecologic cancer   Social History   Socioeconomic History  . Marital status: Married    Spouse name: Not on file  . Number of children: 2  . Years of education: Not on file  . Highest education level: Not on file  Occupational History  .  Not on file  Social Needs  . Financial resource strain: Not on file  . Food insecurity:    Worry: Not on file    Inability: Not on file  . Transportation needs:    Medical: Not on file    Non-medical: Not on file  Tobacco Use  . Smoking status: Never Smoker  . Smokeless tobacco: Never Used  Substance and Sexual Activity  . Alcohol use: Yes    Alcohol/week: 2.0 standard drinks    Types: 2 Glasses of wine per week    Comment: occasional  . Drug use: No  . Sexual activity: Yes    Birth control/protection: Condom  Lifestyle  . Physical activity:    Days per week: Not  on file    Minutes per session: Not on file  . Stress: Not on file  Relationships  . Social connections:    Talks on phone: Not on file    Gets together: Not on file    Attends religious service: Not on file    Active member of club or organization: Not on file    Attends meetings of clubs or organizations: Not on file    Relationship status: Not on file  . Intimate partner violence:    Fear of current or ex partner: Not on file    Emotionally abused: Not on file    Physically abused: Not on file    Forced sexual activity: Not on file  Other Topics Concern  . Not on file  Social History Narrative  . Not on file    Allergies  Allergen Reactions  . Amoxicillin     At end of visit with patient when asked to continue Amoxicillin she reports she may have had a reaction to " this years ago" She does not know what type of reaction. Denies shortness of breath, hives or throat swelling .   Marland Kitchen Lidocaine Other (See Comments)    "fast heart beat, dizziness"    Prior to Admission medications   Medication Sig Start Date End Date Taking? Authorizing Provider  levothyroxine (SYNTHROID, LEVOTHROID) 75 MCG tablet Take by mouth. 09/20/16  Yes [provider]  phenazopyridine (PYRIDIUM) 200 MG tablet Take 1 tablet (200 mg total) by mouth 3 (three) times daily as needed for pain. 04/20/18  Yes McGowan, Hunt Oris, PA-C    Review of Systems  Constitutional: Negative.   HENT: Negative.   Eyes: Negative.   Respiratory: Negative.   Cardiovascular: Negative.   Gastrointestinal: Positive for abdominal pain, nausea and vomiting. Negative for blood in stool, constipation, diarrhea, heartburn and melena.  Genitourinary: Positive for dysuria and frequency. Negative for flank pain, hematuria and urgency.  Musculoskeletal: Negative.   Skin: Negative.   Neurological: Positive for dizziness, sensory change (right medial thigh) and headaches. Negative for tingling, tremors, speech change, focal  weakness, seizures, loss of consciousness and weakness.  Endo/Heme/Allergies: Negative.   Psychiatric/Behavioral: Negative.      Physical Exam BP 122/74   Ht 5' (1.524 m)   Wt 142 lb (64.4 kg)   LMP 04/06/2018 (Exact Date)   BMI 27.73 kg/m  Patient's last menstrual period was 04/06/2018 (exact date). Physical Exam  Constitutional: She is oriented to person, place, and time. She appears well-developed and well-nourished. No distress.  HENT:  Head: Normocephalic and atraumatic.  Eyes: Conjunctivae are normal. No scleral icterus.  Pulmonary/Chest: No respiratory distress.  Musculoskeletal: Normal range of motion. She exhibits no edema.  Neurological: She is alert and oriented to  person, place, and time. No cranial nerve deficit.  Skin: Skin is warm and dry. No erythema.  Psychiatric: She has a normal mood and affect. Her behavior is normal. Judgment normal.   Assessment: 42 y.o. [id number] female here for  1. Right lower quadrant abdominal pain   2. Chronic right-sided low back pain without sciatica   3. Urinary frequency      Plan: Problem List Items Addressed This Visit    None    Visit Diagnoses    Right lower quadrant abdominal pain    -  Primary   Chronic right-sided low back pain without sciatica       Urinary frequency         The source of her pain is difficult to isolate.  She has symptoms in multiple organ systems.  Unifying etiology is not apparent.  I encouraged her to keep her ultrasound appointment on 05/02/2018 to assess her urinary tract.  She did decline a CT scan offered by her urologist to assess for nephroureterolithiasis.  We will await the work-up from the urologist.  Given the multisystem nature of her symptoms, I will attempt to assess her for a psychological issue as well.  There is a language barrier as she does speak reasonably good [place].  However standard instruments for depression screening have been attempted with her in the past and she does not seem  to understand the questions well.  I explained my recommendation to try Pyridium and follow-up with the ultrasound.  Consideration for orthopedic surgery consultation given possible history of herniated disc with apparent radiculopathy to either one or both legs (it is difficult to tell and she gives a different account at different visits).  Gastroenterology consultation may also be reasonable given her nausea and vomiting symptoms.  We discussed that I will continue to follow along with her and make appropriate referrals, as needed.  She does not have a primary care provider, as such.  So, I encouraged her to stay in touch with me as her work-up continues to ensure someone is looking at the big picture.  30 minutes spent in face to face discussion with > 50% spent in counseling,management, and coordination of care of her right lower quadrant abdominal pain, back pain with radiculopathy, urinary frequency, and other assorted symptoms.  Prentice Docker, MD 04/26/2018 6:05 PM

## 2018-04-26 ENCOUNTER — Encounter: Payer: Self-pay | Admitting: Obstetrics and Gynecology

## 2018-05-01 ENCOUNTER — Ambulatory Visit: Payer: Self-pay | Admitting: Urology

## 2018-05-02 ENCOUNTER — Ambulatory Visit: Payer: BLUE CROSS/BLUE SHIELD

## 2018-06-06 ENCOUNTER — Encounter: Payer: Self-pay | Admitting: Urology

## 2018-06-06 NOTE — Progress Notes (Signed)
Certified letter sent 06/07/2018

## 2018-06-20 DIAGNOSIS — R509 Fever, unspecified: Secondary | ICD-10-CM | POA: Diagnosis not present

## 2018-06-20 DIAGNOSIS — R6889 Other general symptoms and signs: Secondary | ICD-10-CM | POA: Diagnosis not present

## 2018-06-20 DIAGNOSIS — J209 Acute bronchitis, unspecified: Secondary | ICD-10-CM | POA: Diagnosis not present

## 2018-06-20 DIAGNOSIS — J019 Acute sinusitis, unspecified: Secondary | ICD-10-CM | POA: Diagnosis not present

## 2018-06-20 DIAGNOSIS — E063 Autoimmune thyroiditis: Secondary | ICD-10-CM | POA: Diagnosis not present

## 2018-06-20 DIAGNOSIS — R05 Cough: Secondary | ICD-10-CM | POA: Diagnosis not present

## 2018-06-26 ENCOUNTER — Ambulatory Visit: Payer: BLUE CROSS/BLUE SHIELD | Admitting: Medical

## 2018-06-26 NOTE — Progress Notes (Deleted)
   Subjective:    Patient ID: Yesenia Moore, female    DOB: Jun 07, 1976, 43 y.o.   MRN: [id number]  HPI 43 yo female in non acute distress.   Review of Systems     Objective:   Physical Exam        Assessment & Plan:

## 2018-06-28 ENCOUNTER — Ambulatory Visit: Payer: BLUE CROSS/BLUE SHIELD | Admitting: Medical

## 2018-06-29 ENCOUNTER — Ambulatory Visit: Payer: BLUE CROSS/BLUE SHIELD | Admitting: Adult Health

## 2018-06-30 ENCOUNTER — Ambulatory Visit: Payer: BLUE CROSS/BLUE SHIELD | Admitting: Registered Nurse

## 2018-07-03 ENCOUNTER — Ambulatory Visit: Payer: BLUE CROSS/BLUE SHIELD | Admitting: Medical

## 2018-07-05 ENCOUNTER — Ambulatory Visit: Payer: BLUE CROSS/BLUE SHIELD | Admitting: Adult Health

## 2018-07-05 ENCOUNTER — Encounter: Payer: Self-pay | Admitting: Adult Health

## 2018-07-05 VITALS — BP 129/83 | HR 96 | Temp 99.2°F | Resp 16 | Ht 60.0 in | Wt 144.0 lb

## 2018-07-05 DIAGNOSIS — J0100 Acute maxillary sinusitis, unspecified: Secondary | ICD-10-CM

## 2018-07-05 DIAGNOSIS — R102 Pelvic and perineal pain: Secondary | ICD-10-CM

## 2018-07-05 LAB — POCT URINALYSIS DIPSTICK
Bilirubin, UA: NEGATIVE
Blood, UA: NEGATIVE
Glucose, UA: NEGATIVE
Ketones, UA: NEGATIVE
LEUKOCYTES UA: NEGATIVE
NITRITE UA: NEGATIVE
PH UA: 6 (ref 5.0–8.0)
PROTEIN UA: NEGATIVE
Spec Grav, UA: 1.01 (ref 1.010–1.025)
UROBILINOGEN UA: 0.2 U/dL

## 2018-07-05 MED ORDER — DOXYCYCLINE HYCLATE 100 MG PO TABS: 100.0000 mg | ORAL_TABLET | Freq: Two times a day (BID) | ORAL | 0 refills | Status: DC

## 2018-07-05 NOTE — Progress Notes (Signed)
Subjective:     Patient ID: Yesenia Moore, female   DOB: [birth year], 43 y.o.   MRN: [id number]  HPI  Vitals:   07/05/18 1518  BP: 129/83  Pulse: 96  Resp: 16  Temp: 99.2 F (37.3 C)   Allergies  Allergen Reactions  . Amoxicillin     At end of visit with patient when asked to continue Amoxicillin she reports she may have had a reaction to " this years ago" She does not know what type of reaction. Denies shortness of breath, hives or throat swelling .   Marland Kitchen Lidocaine Other (See Comments)    "fast heart beat, dizziness"    Patient is a 43 year old female in no acute distress who comes to the clinic for sinus congestion, she reports she got sick with dry cough and cold on 12/30 and went to doctor on 12/31 was give Z-pack for sinusitis. She also was given prednisone. She took two days of prednisone and did take all of the Z-pack.  She reports she felt much better after the antibiotics.  She feels like she still has mild mucous in her throat. She still has mild headache and mild fatigue.   She had a negative chest x ray in December 31 st she reports  Denies any difficulty breathing. She reports " pain in all over body". She reports she has this when she is sick and she has seen her primary care for this in past. She has also since 06/20/18 had some mild pelvic discomfort since November she reports she was told she has fibroids.  Denies urinary symptoms.  Denies any back pain. Reports regular bowel movements.  Denies any abnormal bleeding, or discharge.  She saw gynecology last on   LMP 06/24/17. Regular period per patient. Normal cycles.   She reports  she comes here today to only to have her lungs rechecked.   Patient  denies any fever,chills, rash, chest pain, shortness of breath, nausea, vomiting, or diarrhea.  She reports she does have a primary care Dr. Terrence Dupont.   Allergies  Allergen Reactions  . Amoxicillin     At end of visit with patient when asked to continue Amoxicillin she  reports she may have had a reaction to " this years ago" She does not know what type of reaction. Denies shortness of breath, hives or throat swelling .   Marland Kitchen Lidocaine Other (See Comments)    "fast heart beat, dizziness"     Review of Systems  Constitutional: Positive for fatigue (mild ) and fever (patient denies at home 99.2 in office ). Negative for activity change, appetite change, chills, diaphoresis and unexpected weight change.  HENT: Positive for congestion, postnasal drip and sinus pressure. Negative for dental problem, drooling, ear discharge, ear pain, facial swelling, hearing loss, mouth sores, nosebleeds, rhinorrhea, sinus pain, sneezing, sore throat, tinnitus, trouble swallowing and voice change.   Eyes: Negative.   Respiratory: Negative.   Cardiovascular: Negative.   Gastrointestinal: Negative.   Endocrine: Negative.   Genitourinary: Negative.  Negative for decreased urine volume, difficulty urinating, dyspareunia, dysuria, enuresis, flank pain, frequency, genital sores, hematuria, menstrual problem, pelvic pain (since november denies any change /rates 1/10 today on pain scale / denies any vaginal discharge ), urgency, vaginal bleeding, vaginal discharge and vaginal pain.  Musculoskeletal: Negative.   Skin: Negative.   Neurological: Negative.   Hematological: Negative.   Psychiatric/Behavioral: Negative.        Objective:   Physical Exam Vitals signs reviewed.  Constitutional:      General: She is not in acute distress.    Appearance: Normal appearance. She is not ill-appearing, toxic-appearing or diaphoretic.     Comments: Patient is alert and oriented and responsive to questions Engages in eye contact with provider. Speaks in full sentences without any pauses without any shortness of breath or distress.  Patient moves on and off of exam table and in room without difficulty. Gait is normal in hall and in room. Patient is oriented to person place time and situation. Patient  answers questions appropriately and engages in conversation.   HENT:     Head: Normocephalic and atraumatic.     Jaw: There is normal jaw occlusion.     Salivary Glands: Right salivary gland is not diffusely enlarged or tender. Left salivary gland is not diffusely enlarged or tender.     Right Ear: Hearing, ear canal and external ear normal. A middle ear effusion is present. There is no impacted cerumen. Tympanic membrane is erythematous.     Left Ear: Hearing, ear canal and external ear normal. A middle ear effusion is present. There is no impacted cerumen. Tympanic membrane is not erythematous.     Nose: Mucosal edema and rhinorrhea present. No congestion.     Right Sinus: Maxillary sinus tenderness present. No frontal sinus tenderness.     Left Sinus: Maxillary sinus tenderness present. No frontal sinus tenderness.     Mouth/Throat:     Mouth: Mucous membranes are moist.     Pharynx: No oropharyngeal exudate or posterior oropharyngeal erythema.  Eyes:     General: No scleral icterus.       Right eye: No discharge.        Left eye: No discharge.     Extraocular Movements: Extraocular movements intact.     Conjunctiva/sclera: Conjunctivae normal.     Pupils: Pupils are equal, round, and reactive to light.  Neck:     Musculoskeletal: Normal range of motion and neck supple.  Cardiovascular:     Rate and Rhythm: Normal rate and regular rhythm.     Pulses: Normal pulses.     Heart sounds: Normal heart sounds. No murmur. No friction rub. No gallop.   Pulmonary:     Effort: Pulmonary effort is normal. No respiratory distress.     Breath sounds: Normal breath sounds. No stridor. No wheezing, rhonchi or rales.     Comments: No adventitious lung sounds.  Chest:     Chest wall: No tenderness.  Abdominal:     General: Abdomen is flat. There is no distension.     Palpations: Abdomen is soft. There is no mass.     Tenderness: There is no abdominal tenderness. There is no right CVA tenderness,  left CVA tenderness, guarding or rebound.     Hernia: No hernia is present.     Comments: Abdominal exam completely normal denies any pain with light and deep palpation.   Musculoskeletal: Normal range of motion.  Lymphadenopathy:     Head:     Right side of head: No submental, submandibular, tonsillar, preauricular, posterior auricular or occipital adenopathy.     Left side of head: No submandibular, tonsillar, preauricular, posterior auricular or occipital adenopathy.     Cervical: No cervical adenopathy.     Right cervical: No superficial, deep or posterior cervical adenopathy.    Left cervical: No superficial, deep or posterior cervical adenopathy.     Lower Body: No right inguinal adenopathy. No left inguinal adenopathy.  Skin:    General: Skin is warm and dry.     Capillary Refill: Capillary refill takes less than 2 seconds.  Neurological:     General: No focal deficit present.     Mental Status: She is alert and oriented to person, place, and time.     Cranial Nerves: No cranial nerve deficit.     Sensory: No sensory deficit.     Motor: No weakness.     Coordination: Coordination normal.     Gait: Gait normal.     Deep Tendon Reflexes: Reflexes normal.  Psychiatric:        Mood and Affect: Mood normal.        Behavior: Behavior normal.        Thought Content: Thought content normal.        Judgment: Judgment normal.        Assessment:      Acute non-recurrent maxillary sinusitis  Pelvic pain - Plan: POCT urinalysis dipstick   Plan:      Recommend Doxycycline, patient declines antibiotic .Discussed risks not limited to death versus benefits - patient verbalized understanding then at end of visit decides to proceed with antibiotics.   Meds ordered this encounter  Medications  . doxycycline (VIBRA-TABS) 100 MG tablet    Sig: Take 1 tablet (100 mg total) by mouth 2 (two) times daily.    Dispense:  14 tablet    Refill:  0   Orders Placed This Encounter   Procedures  . POCT urinalysis dipstick   Results for orders placed or performed in visit on 07/05/18 (from the past 24 hour(s))  POCT urinalysis dipstick     Status: Normal   Collection Time: 07/05/18  3:42 PM  Result Value Ref Range   Color, UA yellow    Clarity, UA clear    Glucose, UA Negative Negative   Bilirubin, UA negative    Ketones, UA negative    Spec Grav, UA 1.010 1.010 - 1.025   Blood, UA negative    pH, UA 6.0 5.0 - 8.0   Protein, UA Negative Negative   Urobilinogen, UA 0.2 0.2 or 1.0 E.U./dL   Nitrite, UA negative    Leukocytes, UA Negative Negative   Appearance     Odor none    Gave and reviewed After Visit Summary(AVS) with patient.   She reports she has had recent TSH in past month.   No gynecology exams done in this office currently and no equipment available. Patient is aware she will have to see gynecology if needed for any pelvic/vaginal exam and will follow up with gynecology/obgyn as needed. Yearly gynecology pelvic exam recommended. Patient verbalized understanding of instructions and denies any further questions at this time.  She is advised to see her primary care provider on 07/06/17 and or her gynecologist should any symptoms change or worsen she will be taken to the emergency room immediately or call 911. Patient verbalized understanding of all instructions given and denies any further questions at this time.    Advised patient call the office or your primary care doctor for an appointment if no improvement within 72 hours or if any symptoms change or worsen at any time  Advised ER or urgent Care if after hours or on weekend. Call 911 for emergency symptoms at any time.Patinet verbalized understanding of all instructions given/reviewed and treatment plan and has no further questions or concerns at this time.    Patient verbalized understanding of all instructions given and  denies any further questions at this time.

## 2018-07-05 NOTE — Patient Instructions (Addendum)
Please follow up with your primary care physician within two days from today's visit  for chronic symptoms and go to the emergency room immediately should any new symptoms develop.   No gynecology exams done in this office currently and no equipment available. Patient is aware she will have to see gynecology if needed for any pelvic/vaginal exam and will follow up with gynecology/obgyn as needed. Yearly gynecology pelvic exam recommended. Patient verbalized understanding of instructions and denies any further questions at this time.   Doxycycline tablets or capsules What is this medicine? DOXYCYCLINE (dox i SYE kleen) is a tetracycline antibiotic. It kills certain bacteria or stops their growth. It is used to treat many kinds of infections, like dental, skin, respiratory, and urinary tract infections. It also treats acne, Lyme disease, malaria, and certain sexually transmitted infections. This medicine may be used for other purposes; ask your health care provider or pharmacist if you have questions. COMMON BRAND NAME(S): Acticlate, Adoxa, Adoxa CK, Adoxa Pak, Adoxa TT, Alodox, Avidoxy, Doxal, LYMEPAK, Mondoxyne NL, Monodox, Morgidox 1x, Morgidox 1x Kit, Morgidox 2x, Morgidox 2x Kit, NutriDox, Ocudox, TARGADOX, Vibra-Tabs, Vibramycin What should I tell my health care provider before I take this medicine? They need to know if you have any of these conditions: -liver disease -long exposure to sunlight like working outdoors -stomach problems like colitis -an unusual or allergic reaction to doxycycline, tetracycline antibiotics, other medicines, foods, dyes, or preservatives -pregnant or trying to get pregnant -breast-feeding How should I use this medicine? Take this medicine by mouth with a full glass of water. Follow the directions on the prescription label. It is best to take this medicine without food, but if it upsets your stomach take it with food. Take your medicine at regular intervals. Do not  take your medicine more often than directed. Take all of your medicine as directed even if you think you are better. Do not skip doses or stop your medicine early. Talk to your pediatrician regarding the use of this medicine in children. While this drug may be prescribed for selected conditions, precautions do apply. Overdosage: If you think you have taken too much of this medicine contact a poison control center or emergency room at once. NOTE: This medicine is only for you. Do not share this medicine with others. What if I miss a dose? If you miss a dose, take it as soon as you can. If it is almost time for your next dose, take only that dose. Do not take double or extra doses. What may interact with this medicine? -antacids -barbiturates -birth control pills -bismuth subsalicylate -carbamazepine -methoxyflurane -other antibiotics -phenytoin -vitamins that contain iron -warfarin This list may not describe all possible interactions. Give your health care provider a list of all the medicines, herbs, non-prescription drugs, or dietary supplements you use. Also tell them if you smoke, drink alcohol, or use illegal drugs. Some items may interact with your medicine. What should I watch for while using this medicine? Tell your doctor or health care professional if your symptoms do not improve. Do not treat diarrhea with over the counter products. Contact your doctor if you have diarrhea that lasts more than 2 days or if it is severe and watery. Do not take this medicine just before going to bed. It may not dissolve properly when you lay down and can cause pain in your throat. Drink plenty of fluids while taking this medicine to also help reduce irritation in your throat. This medicine can make you more  sensitive to the sun. Keep out of the sun. If you cannot avoid being in the sun, wear protective clothing and use sunscreen. Do not use sun lamps or tanning beds/booths. Birth control pills may not  work properly while you are taking this medicine. Talk to your doctor about using an extra method of birth control. If you are being treated for a sexually transmitted infection, avoid sexual contact until you have finished your treatment. Your sexual partner may also need treatment. Avoid antacids, aluminum, calcium, magnesium, and iron products for 4 hours before and 2 hours after taking a dose of this medicine. If you are using this medicine to prevent malaria, you should still protect yourself from contact with mosquitos. Stay in screened-in areas, use mosquito nets, keep your body covered, and use an insect repellent. What side effects may I notice from receiving this medicine? Side effects that you should report to your doctor or health care professional as soon as possible: -allergic reactions like skin rash, itching or hives, swelling of the face, lips, or tongue -difficulty breathing -fever -itching in the rectal or genital area -pain on swallowing -redness, blistering, peeling or loosening of the skin, including inside the mouth -severe stomach pain or cramps -unusual bleeding or bruising -unusually weak or tired -yellowing of the eyes or skin Side effects that usually do not require medical attention (report to your doctor or health care professional if they continue or are bothersome): -diarrhea -loss of appetite -nausea, vomiting This list may not describe all possible side effects. Call your doctor for medical advice about side effects. You may report side effects to FDA at 1-800-FDA-1088. Where should I keep my medicine? Keep out of the reach of children. Store at room temperature, below 30 degrees C (86 degrees F). Protect from light. Keep container tightly closed. Throw away any unused medicine after the expiration date. Taking this medicine after the expiration date can make you seriously ill. NOTE: This sheet is a summary. It may not cover all possible information. If you  have questions about this medicine, talk to your doctor, pharmacist, or health care provider.  2019 Elsevier/Gold Standard (2015-07-09 17:11:22)   Pelvic Pain, Female Pelvic pain is pain in your lower belly (abdomen), below your belly button and between your hips. The pain may start suddenly (be acute), keep coming back (be recurring), or last a long time (become chronic). Pelvic pain that lasts longer than 6 months is called chronic pelvic pain. There are many causes of pelvic pain. Sometimes the cause of pelvic pain is not known. Follow these instructions at home:   Take over-the-counter and prescription medicines only as told by your doctor.  Rest as told by your doctor.  Do not have sex if it hurts.  Keep a journal of your pelvic pain. Write down: ? When the pain started. ? Where the pain is located. ? What seems to make the pain better or worse, such as food or your period (menstrual cycle). ? Any symptoms you have along with the pain.  Keep all follow-up visits as told by your doctor. This is important. Contact a doctor if:  Medicine does not help your pain.  Your pain comes back.  You have new symptoms.  You have unusual discharge or bleeding from your vagina.  You have a fever or chills.  You are having trouble pooping (constipation).  You have blood in your pee (urine) or poop (stool).  Your pee smells bad.  You feel weak or light-headed.  Get help right away if:  You have sudden pain that is very bad.  Your pain keeps getting worse.  You have very bad pain and also have any of these symptoms: ? A fever. ? Feeling sick to your stomach (nausea). ? Throwing up (vomiting). ? Being very sweaty.  You pass out (lose consciousness). Summary  Pelvic pain is pain in your lower belly (abdomen), below your belly button and between your hips.  There are many possible causes of pelvic pain.  Keep a journal of your pelvic pain. This information is not intended  to replace advice given to you by your health care provider. Make sure you discuss any questions you have with your health care provider. Document Released: 11/24/2007 Document Revised: 11/23/2017 Document Reviewed: 11/23/2017 Elsevier Interactive Patient Education  2019 Else[employer] Inc. Sinusitis, Adult Sinusitis is soreness and swelling (inflammation) of your sinuses. Sinuses are hollow spaces in the bones around your face. They are located:  Around your eyes.  In the middle of your forehead.  Behind your nose.  In your cheekbones. Your sinuses and nasal passages are lined with a fluid called mucus. Mucus drains out of your sinuses. Swelling can trap mucus in your sinuses. This lets germs (bacteria, virus, or fungus) grow, which leads to infection. Most of the time, this condition is caused by a virus. What are the causes? This condition is caused by:  Allergies.  Asthma.  Germs.  Things that block your nose or sinuses.  Growths in the nose (nasal polyps).  Chemicals or irritants in the air.  Fungus (rare). What increases the risk? You are more likely to develop this condition if:  You have a weak body defense system (immune system).  You do a lot of swimming or diving.  You use nasal sprays too much.  You smoke. What are the signs or symptoms? The main symptoms of this condition are pain and a feeling of pressure around the sinuses. Other symptoms include:  Stuffy nose (congestion).  Runny nose (drainage).  Swelling and warmth in the sinuses.  Headache.  Toothache.  A cough that may get worse at night.  Mucus that collects in the throat or the back of the nose (postnasal drip).  Being unable to smell and taste.  Being very tired (fatigue).  A fever.  Sore throat.  Bad breath. How is this diagnosed? This condition is diagnosed based on:  Your symptoms.  Your medical history.  A physical exam.  Tests to find out if your condition is short-term  (acute) or long-term (chronic). Your doctor may: ? Check your nose for growths (polyps). ? Check your sinuses using a tool that has a light (endoscope). ? Check for allergies or germs. ? Do imaging tests, such as an MRI or CT scan. How is this treated? Treatment for this condition depends on the cause and whether it is short-term or long-term.  If caused by a virus, your symptoms should go away on their own within 10 days. You may be given medicines to relieve symptoms. They include: ? Medicines that shrink swollen tissue in the nose. ? Medicines that treat allergies (antihistamines). ? A spray that treats swelling of the nostrils. ? Rinses that help get rid of thick mucus in your nose (nasal saline washes).  If caused by bacteria, your doctor may wait to see if you will get better without treatment. You may be given antibiotic medicine if you have: ? A very bad infection. ? A weak body defense system.  If caused by growths in the nose, you may need to have surgery. Follow these instructions at home: Medicines  Take, use, or apply over-the-counter and prescription medicines only as told by your doctor. These may include nasal sprays.  If you were prescribed an antibiotic medicine, take it as told by your doctor. Do not stop taking the antibiotic even if you start to feel better. Hydrate and humidify   Drink enough water to keep your pee (urine) pale yellow.  Use a cool mist humidifier to keep the humidity level in your home above 50%.  Breathe in steam for 10-15 minutes, 3-4 times a day, or as told by your doctor. You can do this in the bathroom while a hot shower is running.  Try not to spend time in cool or dry air. Rest  Rest as much as you can.  Sleep with your head raised (elevated).  Make sure you get enough sleep each night. General instructions   Put a warm, moist washcloth on your face 3-4 times a day, or as often as told by your doctor. This will help with  discomfort.  Wash your hands often with soap and water. If there is no soap and water, use hand sanitizer.  Do not smoke. Avoid being around people who are smoking (secondhand smoke).  Keep all follow-up visits as told by your doctor. This is important. Contact a doctor if:  You have a fever.  Your symptoms get worse.  Your symptoms do not get better within 10 days. Get help right away if:  You have a very bad headache.  You cannot stop throwing up (vomiting).  You have very bad pain or swelling around your face or eyes.  You have trouble seeing.  You feel confused.  Your neck is stiff.  You have trouble breathing. Summary  Sinusitis is swelling of your sinuses. Sinuses are hollow spaces in the bones around your face.  This condition is caused by tissues in your nose that become inflamed or swollen. This traps germs. These can lead to infection.  If you were prescribed an antibiotic medicine, take it as told by your doctor. Do not stop taking it even if you start to feel better.  Keep all follow-up visits as told by your doctor. This is important. This information is not intended to replace advice given to you by your health care provider. Make sure you discuss any questions you have with your health care provider. Document Released: 11/24/2007 Document Revised: 11/07/2017 Document Reviewed: 11/07/2017 Elsevier Interactive Patient Education  2019 [employer].

## 2018-07-14 ENCOUNTER — Ambulatory Visit: Payer: BLUE CROSS/BLUE SHIELD | Admitting: Obstetrics and Gynecology

## 2018-08-04 DIAGNOSIS — E038 Other specified hypothyroidism: Secondary | ICD-10-CM | POA: Diagnosis not present

## 2018-08-04 DIAGNOSIS — E559 Vitamin D deficiency, unspecified: Secondary | ICD-10-CM | POA: Diagnosis not present

## 2018-08-04 DIAGNOSIS — E063 Autoimmune thyroiditis: Secondary | ICD-10-CM | POA: Diagnosis not present

## 2018-08-04 DIAGNOSIS — E611 Iron deficiency: Secondary | ICD-10-CM | POA: Diagnosis not present

## 2018-08-09 DIAGNOSIS — E063 Autoimmune thyroiditis: Secondary | ICD-10-CM | POA: Diagnosis not present

## 2018-08-09 DIAGNOSIS — E038 Other specified hypothyroidism: Secondary | ICD-10-CM | POA: Diagnosis not present

## 2018-10-10 DIAGNOSIS — L718 Other rosacea: Secondary | ICD-10-CM | POA: Diagnosis not present

## 2018-10-10 DIAGNOSIS — L738 Other specified follicular disorders: Secondary | ICD-10-CM | POA: Diagnosis not present

## 2018-11-20 DIAGNOSIS — L718 Other rosacea: Secondary | ICD-10-CM | POA: Diagnosis not present

## 2018-11-20 DIAGNOSIS — R21 Rash and other nonspecific skin eruption: Secondary | ICD-10-CM | POA: Diagnosis not present

## 2018-12-05 ENCOUNTER — Other Ambulatory Visit: Payer: Self-pay | Admitting: Internal Medicine

## 2018-12-05 DIAGNOSIS — L719 Rosacea, unspecified: Secondary | ICD-10-CM

## 2018-12-05 DIAGNOSIS — R1031 Right lower quadrant pain: Secondary | ICD-10-CM | POA: Diagnosis not present

## 2018-12-05 DIAGNOSIS — E063 Autoimmune thyroiditis: Secondary | ICD-10-CM

## 2018-12-05 DIAGNOSIS — R110 Nausea: Secondary | ICD-10-CM | POA: Diagnosis not present

## 2018-12-06 ENCOUNTER — Other Ambulatory Visit: Payer: Self-pay

## 2018-12-06 ENCOUNTER — Ambulatory Visit
Admission: RE | Admit: 2018-12-06 | Discharge: 2018-12-06 | Disposition: A | Discharge status: Home / Self Care | Payer: BC Managed Care – PPO | Source: Ambulatory Visit | Attending: Internal Medicine | Admitting: Internal Medicine

## 2018-12-06 DIAGNOSIS — R1031 Right lower quadrant pain: Secondary | ICD-10-CM | POA: Diagnosis not present

## 2018-12-06 DIAGNOSIS — L719 Rosacea, unspecified: Secondary | ICD-10-CM | POA: Diagnosis not present

## 2018-12-06 DIAGNOSIS — E063 Autoimmune thyroiditis: Secondary | ICD-10-CM

## 2018-12-06 MED ORDER — IOHEXOL 300 MG/ML  SOLN
100.0000 mL | Freq: Once | INTRAMUSCULAR | Status: AC | PRN
  Administered 2018-12-06: 100 mL via INTRAVENOUS

## 2018-12-06 MED ORDER — IOPAMIDOL (ISOVUE-300) INJECTION 61%: 100.0000 mL | Freq: Once | INTRAVENOUS | Status: DC | PRN

## 2018-12-07 ENCOUNTER — Ambulatory Visit: Payer: BC Managed Care – PPO

## 2018-12-21 DIAGNOSIS — R51 Headache: Secondary | ICD-10-CM | POA: Diagnosis not present

## 2018-12-21 DIAGNOSIS — S30861A Insect bite (nonvenomous) of abdominal wall, initial encounter: Secondary | ICD-10-CM | POA: Diagnosis not present

## 2018-12-21 DIAGNOSIS — W57XXXA Bitten or stung by nonvenomous insect and other nonvenomous arthropods, initial encounter: Secondary | ICD-10-CM | POA: Diagnosis not present

## 2018-12-21 DIAGNOSIS — M791 Myalgia, unspecified site: Secondary | ICD-10-CM | POA: Diagnosis not present

## 2019-02-19 DIAGNOSIS — R42 Dizziness and giddiness: Secondary | ICD-10-CM | POA: Diagnosis not present

## 2019-02-19 DIAGNOSIS — L719 Rosacea, unspecified: Secondary | ICD-10-CM | POA: Diagnosis not present

## 2019-02-19 DIAGNOSIS — R5383 Other fatigue: Secondary | ICD-10-CM | POA: Diagnosis not present

## 2019-02-19 DIAGNOSIS — R112 Nausea with vomiting, unspecified: Secondary | ICD-10-CM | POA: Diagnosis not present

## 2019-02-19 DIAGNOSIS — E063 Autoimmune thyroiditis: Secondary | ICD-10-CM | POA: Diagnosis not present

## 2019-02-19 DIAGNOSIS — R5381 Other malaise: Secondary | ICD-10-CM | POA: Diagnosis not present

## 2019-02-20 ENCOUNTER — Other Ambulatory Visit: Payer: Self-pay | Admitting: Internal Medicine

## 2019-02-20 ENCOUNTER — Ambulatory Visit: Payer: BLUE CROSS/BLUE SHIELD | Admitting: Physician Assistant

## 2019-02-20 DIAGNOSIS — Z1231 Encounter for screening mammogram for malignant neoplasm of breast: Secondary | ICD-10-CM

## 2019-02-20 DIAGNOSIS — R42 Dizziness and giddiness: Secondary | ICD-10-CM

## 2019-02-28 ENCOUNTER — Ambulatory Visit
Admission: RE | Admit: 2019-02-28 | Discharge: 2019-02-28 | Disposition: A | Discharge status: Home / Self Care | Payer: BC Managed Care – PPO | Source: Ambulatory Visit | Attending: Internal Medicine | Admitting: Internal Medicine

## 2019-02-28 ENCOUNTER — Other Ambulatory Visit: Payer: Self-pay

## 2019-02-28 DIAGNOSIS — R42 Dizziness and giddiness: Secondary | ICD-10-CM | POA: Diagnosis not present

## 2019-02-28 MED ORDER — GADOBUTROL 1 MMOL/ML IV SOLN
6.0000 mL | Freq: Once | INTRAVENOUS | Status: AC | PRN
  Administered 2019-02-28: 19:00:00 6 mL via INTRAVENOUS

## 2019-03-05 ENCOUNTER — Ambulatory Visit: Payer: BC Managed Care – PPO

## 2019-03-06 DIAGNOSIS — R112 Nausea with vomiting, unspecified: Secondary | ICD-10-CM | POA: Diagnosis not present

## 2019-03-06 DIAGNOSIS — R42 Dizziness and giddiness: Secondary | ICD-10-CM | POA: Diagnosis not present

## 2019-03-06 DIAGNOSIS — E063 Autoimmune thyroiditis: Secondary | ICD-10-CM | POA: Diagnosis not present

## 2019-03-06 DIAGNOSIS — R5381 Other malaise: Secondary | ICD-10-CM | POA: Diagnosis not present

## 2019-03-06 DIAGNOSIS — R51 Headache: Secondary | ICD-10-CM | POA: Diagnosis not present

## 2019-03-06 DIAGNOSIS — L719 Rosacea, unspecified: Secondary | ICD-10-CM | POA: Diagnosis not present

## 2019-03-06 DIAGNOSIS — D1800 Hemangioma unspecified site: Secondary | ICD-10-CM | POA: Diagnosis not present

## 2019-03-07 DIAGNOSIS — R0789 Other chest pain: Secondary | ICD-10-CM | POA: Diagnosis not present

## 2019-03-07 DIAGNOSIS — R0602 Shortness of breath: Secondary | ICD-10-CM | POA: Diagnosis not present

## 2019-03-07 DIAGNOSIS — I208 Other forms of angina pectoris: Secondary | ICD-10-CM | POA: Diagnosis not present

## 2019-03-12 ENCOUNTER — Ambulatory Visit: Admission: RE | Admit: 2019-03-12 | Payer: BC Managed Care – PPO | Source: Ambulatory Visit | Admitting: *Deleted

## 2019-03-14 DIAGNOSIS — D1800 Hemangioma unspecified site: Secondary | ICD-10-CM | POA: Diagnosis not present

## 2019-03-14 DIAGNOSIS — R739 Hyperglycemia, unspecified: Secondary | ICD-10-CM | POA: Diagnosis not present

## 2019-03-14 DIAGNOSIS — F411 Generalized anxiety disorder: Secondary | ICD-10-CM | POA: Diagnosis not present

## 2019-03-14 DIAGNOSIS — M542 Cervicalgia: Secondary | ICD-10-CM | POA: Diagnosis not present

## 2019-04-12 DIAGNOSIS — R0602 Shortness of breath: Secondary | ICD-10-CM | POA: Diagnosis not present

## 2019-04-25 DIAGNOSIS — D1800 Hemangioma unspecified site: Secondary | ICD-10-CM | POA: Diagnosis not present

## 2019-09-09 ENCOUNTER — Ambulatory Visit: Payer: Self-pay | Attending: Internal Medicine

## 2019-09-09 DIAGNOSIS — Z23 Encounter for immunization: Secondary | ICD-10-CM

## 2019-09-09 NOTE — Progress Notes (Signed)
   Covid-19 Vaccination Clinic  Name:  Yesenia Moore    MRN: [id number] DOB: [birth year]  09/09/2019  Yesenia Moore was observed post Covid-19 immunization for 15 minutes without incident. She was provided with Vaccine Information Sheet and instruction to access the V-Safe system.   Yesenia Moore was instructed to call 911 with any severe reactions post vaccine: Marland Kitchen Difficulty breathing  . Swelling of face and throat  . A fast heartbeat  . A bad rash all over body  . Dizziness and weakness   Immunizations Administered    Name Date Dose VIS Date Route   Pfizer COVID-19 Vaccine 09/09/2019  6:46 PM 0.3 mL 06/01/2019 Intramuscular   Manufacturer: [place]   Lot: [id number]   [place]: [id number]

## 2019-09-30 ENCOUNTER — Ambulatory Visit: Payer: Self-pay | Attending: Internal Medicine

## 2019-10-05 ENCOUNTER — Other Ambulatory Visit: Payer: Self-pay

## 2019-10-05 ENCOUNTER — Ambulatory Visit: Payer: Self-pay | Attending: Internal Medicine

## 2019-10-05 DIAGNOSIS — Z23 Encounter for immunization: Secondary | ICD-10-CM

## 2019-10-05 NOTE — Progress Notes (Signed)
   Covid-19 Vaccination Clinic  Name:  Yesenia Moore    MRN: [id number] DOB: 1975-12-13  10/05/2019  [name] was observed post Covid-19 immunization for 15 minutes without incident. She was provided with Vaccine Information Sheet and instruction to access the V-Safe system.   [name] was instructed to call 911 with any severe reactions post vaccine: Marland Kitchen Difficulty breathing  . Swelling of face and throat  . A fast heartbeat  . A bad rash all over body  . Dizziness and weakness   Immunizations Administered    Name Date Dose VIS Date Route   Pfizer COVID-19 Vaccine 10/05/2019  6:01 PM 0.3 mL 06/01/2019 Intramuscular   Manufacturer: [place]   Lot: [id number]   [place]: [id number]

## 2019-11-13 ENCOUNTER — Ambulatory Visit: Payer: BLUE CROSS/BLUE SHIELD | Admitting: Obstetrics and Gynecology

## 2019-12-06 ENCOUNTER — Other Ambulatory Visit: Payer: Self-pay

## 2019-12-06 ENCOUNTER — Ambulatory Visit (INDEPENDENT_AMBULATORY_CARE_PROVIDER_SITE_OTHER): Payer: BC Managed Care – PPO | Admitting: Obstetrics and Gynecology

## 2019-12-06 ENCOUNTER — Encounter: Payer: Self-pay | Admitting: Obstetrics and Gynecology

## 2019-12-06 VITALS — BP 118/70 | Ht 60.0 in | Wt 137.0 lb

## 2019-12-06 DIAGNOSIS — Z01419 Encounter for gynecological examination (general) (routine) without abnormal findings: Secondary | ICD-10-CM | POA: Diagnosis not present

## 2019-12-06 DIAGNOSIS — E063 Autoimmune thyroiditis: Secondary | ICD-10-CM | POA: Diagnosis not present

## 2019-12-06 DIAGNOSIS — Z1339 Encounter for screening examination for other mental health and behavioral disorders: Secondary | ICD-10-CM | POA: Diagnosis not present

## 2019-12-06 DIAGNOSIS — Z0000 Encounter for general adult medical examination without abnormal findings: Secondary | ICD-10-CM

## 2019-12-06 DIAGNOSIS — Z8632 Personal history of gestational diabetes: Secondary | ICD-10-CM

## 2019-12-06 DIAGNOSIS — Z1331 Encounter for screening for depression: Secondary | ICD-10-CM

## 2019-12-06 NOTE — Progress Notes (Signed)
Gynecology Annual Exam   PCP: Tracie Harrier, MD  Chief Complaint  Patient presents with  . Annual Exam   History of Present Illness:  Ms. Yesenia Moore is a 44 y.o. G2P2002 who LMP was Patient's last menstrual period was 11/22/2019., presents today for her annual examination.  Her menses are regular every 28-30 days, lasting 3 day(s).  Dysmenorrhea none. She has some brownish discharge about 1 week prior to her periods. The amount is not a lot.    She is sexually active. She  Uses condoms for birth control.   Last Pap: 2019  Results were: no abnormalities /neg HPV DNA negative Hx of STDs: none  Last mammogram: never There is no FH of breast cancer. There is no FH of ovarian cancer. The patient does do self-breast exams.  Colonoscopy: never had DEXA: has not been screened for osteoporosis  Tobacco use: The patient denies current or previous tobacco use. Alcohol use: social drinker Exercise: 2-3 times per week  The patient wears seatbelts: yes.     She would like to lower her dose of thyroid medication.  She would like to alternate dosing of 75 mcg and 50 mcg each day with the goal of dropping to 50 mcg per day every day.   Given her history of gestational diabetes, she would like to check her hemoglobin A1c, as well.  She also would like to check her vitamin D level.  Past Medical History:  Diagnosis Date  . Anemia   . Diabetes mellitus without complication (HCC)    Gestational diabetes  . Hashimoto's thyroiditis   . Thyroid disease     Past Surgical History:  Procedure Laterality Date  . CESAREAN SECTION    . CESAREAN SECTION N/A 03/27/2015   Procedure: CESAREAN SECTION;  Surgeon: Will Bonnet, MD;  Location: [facility] ORS;  Service: Obstetrics;  Laterality: N/A;  . CESAREAN SECTION      Prior to Admission medications   Medication Sig Start Date End Date Taking? Authorizing Provider  levothyroxine (SYNTHROID, LEVOTHROID) 75 MCG tablet Take by mouth. 09/20/16   Yes [provider]    Allergies  Allergen Reactions  . Amoxicillin     At end of visit with patient when asked to continue Amoxicillin she reports she may have had a reaction to " this years ago" She does not know what type of reaction. Denies shortness of breath, hives or throat swelling .   Marland Kitchen Lidocaine Other (See Comments)    "fast heart beat, dizziness"   Obstetric History: [id number]  History reviewed. No pertinent family history.  Social History   Socioeconomic History  . Marital status: Married    Spouse name: Not on file  . Number of children: 2  . Years of education: Not on file  . Highest education level: Not on file  Occupational History  . Not on file  Tobacco Use  . Smoking status: Never Smoker  . Smokeless tobacco: Never Used  Vaping Use  . Vaping Use: Never used  Substance and Sexual Activity  . Alcohol use: Yes    Alcohol/week: 2.0 standard drinks    Types: 2 Glasses of wine per week    Comment: occasional  . Drug use: No  . Sexual activity: Yes    Birth control/protection: Condom  Other Topics Concern  . Not on file  Social History Narrative  . Not on file   Social Determinants of Health   Financial Resource Strain:   . Difficulty  of Paying Living Expenses:   Food Insecurity:   . Worried About Charity fundraiser in the Last Year:   . Arboriculturist in the Last Year:   Transportation Needs:   . Film/video editor (Medical):   Marland Kitchen Lack of Transportation (Non-Medical):   Physical Activity:   . Days of Exercise per Week:   . Minutes of Exercise per Session:   Stress:   . Feeling of Stress :   Social Connections:   . Frequency of Communication with Friends and Family:   . Frequency of Social Gatherings with Friends and Family:   . Attends Religious Services:   . Active Member of Clubs or Organizations:   . Attends Archivist Meetings:   Marland Kitchen Marital Status:   Intimate Partner Violence:   . Fear of Current or Ex-Partner:     . Emotionally Abused:   Marland Kitchen Physically Abused:   . Sexually Abused:     Review of Systems  Constitutional: Negative.   HENT: Negative.   Eyes: Negative.   Respiratory: Negative.   Cardiovascular: Negative.   Gastrointestinal: Negative.   Genitourinary: Negative.   Musculoskeletal: Negative.   Skin: Negative.   Neurological: Negative.   Psychiatric/Behavioral: Negative.      Physical Exam BP 118/70   Ht 5' (1.524 m)   Wt 137 lb (62.1 kg)   LMP 11/22/2019   BMI 26.76 kg/m   Physical Exam Constitutional:      General: She is not in acute distress.    Appearance: Normal appearance. She is well-developed.  Genitourinary:     Pelvic exam was performed with patient in the lithotomy position.     Vulva, inguinal canal, urethra, bladder, vagina, uterus, right adnexa and left adnexa normal.     No posterior fourchette tenderness, injury or lesion present.     No inguinal adenopathy present in the right or left side.    No signs of injury in the vagina.     No vaginal discharge, erythema, tenderness or bleeding.     No cervical motion tenderness, discharge, friability, lesion, bleeding or polyp.     Uterus is mobile.     Uterus is not enlarged or tender.     No uterine mass detected.    Uterus is anteverted.     No right or left adnexal mass present.     Right adnexa not tender or full.     Left adnexa not tender or full.  HENT:     Head: Normocephalic and atraumatic.  Eyes:     General: No scleral icterus.    Conjunctiva/sclera: Conjunctivae normal.  Neck:     Thyroid: No thyromegaly.  Cardiovascular:     Rate and Rhythm: Normal rate and regular rhythm.     Heart sounds: No murmur heard.  No friction rub. No gallop.   Pulmonary:     Effort: Pulmonary effort is normal. No respiratory distress.     Breath sounds: Normal breath sounds. No wheezing or rales.  Chest:     Breasts:        Right: No swelling, bleeding, inverted nipple, mass, nipple discharge, skin change or  tenderness.        Left: No swelling, bleeding, inverted nipple, mass, nipple discharge, skin change or tenderness.  Abdominal:     General: Bowel sounds are normal. There is no distension.     Palpations: Abdomen is soft. There is no mass.     Tenderness: There is  no abdominal tenderness. There is no guarding or rebound.  Musculoskeletal:        General: No swelling or tenderness. Normal range of motion.     Cervical back: Normal range of motion and neck supple.  Lymphadenopathy:     Cervical: No cervical adenopathy.     Upper Body:     Right upper body: No supraclavicular or axillary adenopathy.     Left upper body: No supraclavicular or axillary adenopathy.     Lower Body: No right inguinal adenopathy. No left inguinal adenopathy.  Neurological:     General: No focal deficit present.     Mental Status: She is alert and oriented to person, place, and time.     Cranial Nerves: No cranial nerve deficit.  Skin:    General: Skin is warm and dry.     Findings: No erythema or rash.  Psychiatric:        Mood and Affect: Mood normal.        Behavior: Behavior normal.        Judgment: Judgment normal.    Female chaperone present for pelvic and breast  portions of the physical exam  Results: AUDIT Questionnaire (screen for alcoholism): 2 PHQ-9: 1  Assessment: 44 y.o. [id number] female here for routine gynecologic examination.  Plan: Problem List Items Addressed This Visit      Endocrine   Hashimoto's thyroiditis   Relevant Orders   TSH + free T4 (Completed)   VITAMIN D 25 Hydroxy (Vit-D Deficiency, Fractures) (Completed)    Other Visit Diagnoses    Women's annual routine gynecological examination    -  Primary   Relevant Orders   TSH + free T4 (Completed)   Hemoglobin A1c (Completed)   VITAMIN D 25 Hydroxy (Vit-D Deficiency, Fractures) (Completed)   Screening for depression       Screening for alcoholism       History of gestational diabetes       Relevant Orders    Hemoglobin A1c (Completed)   Laboratory examination ordered as part of a routine general medical examination       Relevant Orders   VITAMIN D 25 Hydroxy (Vit-D Deficiency, Fractures) (Completed)      Screening: -- Blood pressure screen normal -- Colonoscopy - not due -- Mammogram - due. Patient to call [facility] to arrange. She understands that it is her responsibility to arrange this. -- Weight screening: normal -- Depression screening negative (PHQ-9) -- Nutrition: normal -- cholesterol screening: not due for screening -- osteoporosis screening: not due -- tobacco screening: not using -- alcohol screening: AUDIT questionnaire indicates low-risk usage. -- family history of breast cancer screening: done. not at high risk. -- no evidence of domestic violence or intimate partner violence. -- STD screening: gonorrhea/chlamydia NAAT not collected per patient request. -- pap smear not collected per ASCCP guidelines  Prentice Docker, MD 12/06/2019 9:53 AM

## 2019-12-06 NOTE — Patient Instructions (Signed)
[facility] for Mammogram  Located in: [facility] Address: [facility], [address], [place], [place] [address] Hours: Open ? Closes 5PM Phone: [phone]

## 2019-12-07 ENCOUNTER — Other Ambulatory Visit: Payer: Self-pay | Admitting: Obstetrics and Gynecology

## 2019-12-07 DIAGNOSIS — E063 Autoimmune thyroiditis: Secondary | ICD-10-CM

## 2019-12-07 LAB — VITAMIN D 25 HYDROXY (VIT D DEFICIENCY, FRACTURES): Vit D, 25-Hydroxy: 38.8 ng/mL (ref 30.0–100.0)

## 2019-12-07 LAB — HEMOGLOBIN A1C
Est. average glucose Bld gHb Est-mCnc: 108 mg/dL
Hgb A1c MFr Bld: 5.4 % (ref 4.8–5.6)

## 2019-12-07 LAB — TSH+FREE T4
Free T4: 1.4 ng/dL (ref 0.82–1.77)
TSH: 2.19 u[IU]/mL (ref 0.450–4.500)

## 2019-12-07 MED ORDER — LEVOTHYROXINE SODIUM 50 MCG PO TABS: 50.0000 ug | ORAL_TABLET | ORAL | 0 refills | Status: DC

## 2019-12-08 ENCOUNTER — Encounter: Payer: Self-pay | Admitting: Obstetrics and Gynecology

## 2019-12-18 ENCOUNTER — Other Ambulatory Visit: Payer: Self-pay | Admitting: Obstetrics and Gynecology

## 2019-12-18 DIAGNOSIS — E063 Autoimmune thyroiditis: Secondary | ICD-10-CM

## 2020-01-10 ENCOUNTER — Other Ambulatory Visit: Payer: Self-pay | Admitting: Obstetrics and Gynecology

## 2020-01-10 DIAGNOSIS — E063 Autoimmune thyroiditis: Secondary | ICD-10-CM

## 2020-01-10 NOTE — Telephone Encounter (Signed)
Advise

## 2020-02-22 ENCOUNTER — Other Ambulatory Visit: Payer: Self-pay

## 2020-02-22 ENCOUNTER — Other Ambulatory Visit: Payer: BC Managed Care – PPO

## 2020-02-22 DIAGNOSIS — E063 Autoimmune thyroiditis: Secondary | ICD-10-CM

## 2020-02-23 LAB — TSH+FREE T4
Free T4: 1.26 ng/dL (ref 0.82–1.77)
TSH: 3.23 u[IU]/mL (ref 0.450–4.500)

## 2020-03-14 ENCOUNTER — Ambulatory Visit: Payer: BC Managed Care – PPO | Admitting: Obstetrics and Gynecology

## 2020-03-24 ENCOUNTER — Encounter: Payer: Self-pay | Admitting: Medical

## 2020-03-24 ENCOUNTER — Telehealth: Payer: BC Managed Care – PPO | Admitting: Medical

## 2020-03-24 ENCOUNTER — Other Ambulatory Visit: Payer: Self-pay

## 2020-03-24 NOTE — Progress Notes (Signed)
° °  Subjective:    Patient ID: Yesenia Moore, female    DOB: 03/25/1976, 44 y.o.   MRN: [id number]  HPI 44 yo female in non acute distress consents to telemedicine appointment. Muscle aches, joint pain., headache and numbess on the back of head intermitently, for about a week, Friday was the worst day she states  and Sunday she felt better. Today she woke up with a headache again. She would like blood work/ urine dip to see what is wrong with her.   She is Vaccinated  Allergies  Allergen Reactions   Amoxicillin     At end of visit with patient when asked to continue Amoxicillin she reports she may have had a reaction to " this years ago" She does not know what type of reaction. Denies shortness of breath, hives or throat swelling .    Lidocaine Other (See Comments)    "fast heart beat, dizziness"    Review of Systems  Constitutional: Positive for fatigue. Negative for chills and fever.  HENT: Negative for congestion, postnasal drip, rhinorrhea, sinus pressure, sinus pain and sore throat.   Respiratory: Negative for shortness of breath.   Cardiovascular: Negative for chest pain.  Musculoskeletal: Positive for arthralgias and myalgias.  Neurological: Positive for numbness (back of head) and headaches.       Objective:   Physical Exam  AXOX3  No physical was performed due to telemedicine appointmet      Assessment & Plan:  Myalgias, Arthralgias, Headache, fatigue Explained to patient that she has symptoms that over lap with Covid-19. And she would needs to get tested. Then she is to see her primary care provider so he may order the labs that he would like completed. She then can come to the [facility] and we will be happy to draw her blood and order his tests at no cost to her.   She would prefer to come into the office and she does not think she has Covid-19  because she  Is vaccinated. Tried to explain that the symptoms she is having could be Covid-19 related ,  And even  vaccinated persons can get Covid-19. She states she has not been around anyone sick. Explained a second time that even if you are vaccinated you can get Covid-19 but the symptoms are not as  severe. So sometimes the less severe symptomatic patient do not seek out medical care right away.  She is a spouse so to get Covid-19 tested I sent an Epic email that she can go to  [employer] with no cost. Call the office as needed. If she does get tested I would like for her call and update me with the  test results.  Follow up with your PCP for prescription or may be placed in Epic. She verbalizes understanding and has no questions at the end of our conversaton.

## 2020-03-24 NOTE — Patient Instructions (Signed)
3 Key Steps to Take While Waiting for Your COVID-19 Test Result °To help stop the spread of COVID-19, take these 3 key steps NOW while waiting for your test results: °1. Stay home and monitor your health. °Stay home and monitor your health to help protect your friends, family, and others from possibly getting COVID-19 from you. °Stay home and away from others: °· If possible, stay away from others, especially people who are at higher risk for getting very sick from COVID-19, such as older adults and people with other medical conditions. °· If you have been in contact with someone with COVID-19, stay home and away from others for 14 days after your last contact with that person. °· If you have a fever, cough or other symptoms of COVID-19, stay home and away from others (except to get medical care). °Monitor your health: °· Watch for fever, cough, shortness of breath, or other symptoms of COVID-19. Remember, symptoms may appear 2-14 days after exposure to COVID-19 and can include: °? Fever or chills °? Cough °? Shortness of breath or difficulty breathing °? Tiredness °? Muscle or body aches °? Headache °? New loss of taste or smell °? Sore throat °? Congestion or runny nose °? Nausea or vomiting °? Diarrhea °2. Think about the people you have recently been around. °If you are diagnosed with COVID-19, a public health worker may call you to check on your health, discuss who you have been around, and ask where you spent time while you may have been able to spread COVID-19 to others. While you wait for your COVID-19 test result, think about everyone you have been around recently. This will be important information to give health workers if your test is positive.  °Complete the information on the back of this page to help you remember everyone you have been around. °3. Answer the phone call from the health department. °If a public health worker calls you, answer the call to help slow the spread of COVID-19 in your  community. °· Discussions with health department staff are confidential. This means that your personal and medical information will be kept private and only shared with those who may need to know, like your health care provider. °· Your name will not be shared with those you came in contact with. The health department will only notify people you were in close contact with (within 6 feet for more than 15 minutes) that they might have been exposed to COVID-19. °Think about the people you have recently been around °If you test positive and are diagnosed with COVID-19, someone from the health department may call to check-in on your health, discuss who you have been around, and ask where you spent time while you may have been able to spread COVID-19 to others. This form can help you think about people you have recently been around so you will be ready if a public health worker calls you. °Things to think about. Have you: °· Gone to work or school? °· Gotten together with others (eaten out at a restaurant, gone out for drinks, exercised with others or gone to a gym, had friends or family over to your house, volunteered, gone to a party, pool, or park)? °· Gone to a store in person (e.g., grocery store, mall)? °· Gone to in-person appointments (e.g., salon, barber, doctor's or dentist's office)? °· Ridden in a car with others (e.g., Uber or Lyft) or took public transportation? °· Been inside a church, synagogue, mosque or other places   of worship? °Who lives with you? °· ______________________________________________________________________ °· ______________________________________________________________________ °· ______________________________________________________________________ °· ______________________________________________________________________ °Who have you been around (within 6 feet for more than 15 minutes) in the last 10 days? (You may have more people to list than the space provided. If so, write on the  front of this sheet or a separate piece of paper.) °Name ______________________________________________ °· Phone number ____________________________________ °· Date you last saw them _____________________________ °· Where you last saw them ________________________________________________ °Name ______________________________________________ °· Phone number ____________________________________ °· Date you last saw them _____________________________ °· Where you last saw them ________________________________________________ °Name ______________________________________________ °· Phone number ____________________________________ °· Date you last saw them _____________________________ °· Where you last saw them ________________________________________________ °Name ______________________________________________ °· Phone number ____________________________________ °· Date you last saw them _____________________________ °· Where you last saw them ________________________________________________ °Name ______________________________________________ °· Phone number ____________________________________ °· Date you last saw them _____________________________ °· Where you last saw them ________________________________________________ °What have you done in the last 10 days with other people? °Activity _____________________________________________ °· Location _________________________________________ °· Date ____________________________________________ °Activity _____________________________________________ °· Location _________________________________________ °· Date ____________________________________________ °Activity _____________________________________________ °· Location _________________________________________ °· Date ____________________________________________ °Activity _____________________________________________ °· Location _________________________________________ °· Date  ____________________________________________ °Activity _____________________________________________ °· Location _________________________________________ °· Date ____________________________________________ °cdc.gov/coronavirus °01/15/2019 °This information is not intended to replace advice given to you by your health care provider. Make sure you discuss any questions you have with your health care provider. °Document Revised: 05/24/2019 Document Reviewed: 05/24/2019 °Elsevier Patient Education © 2020 [employer] ° °

## 2020-04-14 ENCOUNTER — Ambulatory Visit: Payer: BC Managed Care – PPO | Admitting: Obstetrics and Gynecology

## 2020-05-05 ENCOUNTER — Ambulatory Visit: Payer: BC Managed Care – PPO | Admitting: Obstetrics and Gynecology

## 2020-06-18 ENCOUNTER — Telehealth: Payer: Self-pay

## 2020-06-18 NOTE — Telephone Encounter (Signed)
Patient called today for RF of her Rosacea cream to [facility]. Patient has not been seen in our office since before our ransom ware attack. I can see her RX was sent in June to [facility] and October through the portal. Patient was declined RX RF then transferred to front staff to schedule appointment. Patient was not nice to front staff so I got back on the phone with patient to advise her we can schedule in April (next opening) and call her with any openings/cancellations. Patient began yelling at me and stated she was not coming back to our office to spend money that she just needed a RF on her medication. Advised patient multiple times that I could not RF medications without being seen within the calendar year. Patient states she hopes I get a rash on my face and hung up the phone.   Patient did not scheduled follow up appointment.

## 2020-07-15 ENCOUNTER — Ambulatory Visit: Payer: BC Managed Care – PPO | Admitting: Obstetrics and Gynecology

## 2020-07-22 ENCOUNTER — Encounter: Payer: Self-pay | Admitting: Obstetrics and Gynecology

## 2020-07-22 ENCOUNTER — Ambulatory Visit: Payer: BC Managed Care – PPO | Admitting: Obstetrics and Gynecology

## 2020-07-22 ENCOUNTER — Other Ambulatory Visit: Payer: Self-pay

## 2020-07-22 VITALS — BP 126/70 | Ht 60.0 in | Wt 148.0 lb

## 2020-07-22 DIAGNOSIS — R5383 Other fatigue: Secondary | ICD-10-CM

## 2020-07-22 DIAGNOSIS — E039 Hypothyroidism, unspecified: Secondary | ICD-10-CM | POA: Diagnosis not present

## 2020-07-22 DIAGNOSIS — R1031 Right lower quadrant pain: Secondary | ICD-10-CM

## 2020-07-22 DIAGNOSIS — N644 Mastodynia: Secondary | ICD-10-CM

## 2020-07-22 NOTE — Progress Notes (Signed)
Obstetrics & Gynecology Office Visit   Chief Complaint  Patient presents with  . Follow-up  . Breast Pain   History of Present Illness: 45 y.o. G70P2002 female who presents with left axillary discomfort. She had a normal mammogram in 11/2019.  She notes no lumps, skin changes, nipple discharge, and skin changes. She has intermittent discomfort in that area.  She also notes feeling more tired and has gained some weight and wonders if her thyroid medication dosing is correct at the moment.   She also notes a long history of intermittent RLQ pain. The pain does not radiate, is mild.  She notes no aggravating or alleviating factors. She denies any associated symptoms.    Past Medical History:  Diagnosis Date  . Anemia   . Diabetes mellitus without complication (HCC)    Gestational diabetes  . Hashimoto's thyroiditis   . Thyroid disease     Past Surgical History:  Procedure Laterality Date  . CESAREAN SECTION    . CESAREAN SECTION N/A 03/27/2015   Procedure: CESAREAN SECTION;  Surgeon: Will Bonnet, MD;  Location: [facility] ORS;  Service: Obstetrics;  Laterality: N/A;  . CESAREAN SECTION      Gynecologic History: Patient's last menstrual period was 07/15/2020.  Obstetric History: [id number]  History reviewed. No pertinent family history.  Social History   Socioeconomic History  . Marital status: Married    Spouse name: Not on file  . Number of children: 2  . Years of education: Not on file  . Highest education level: Not on file  Occupational History  . Not on file  Tobacco Use  . Smoking status: Never Smoker  . Smokeless tobacco: Never Used  Vaping Use  . Vaping Use: Never used  Substance and Sexual Activity  . Alcohol use: Yes    Alcohol/week: 2.0 standard drinks    Types: 2 Glasses of wine per week    Comment: occasional  . Drug use: No  . Sexual activity: Yes    Birth control/protection: Condom  Other Topics Concern  . Not on file  Social History Narrative  .  Not on file   Social Determinants of Health   Financial Resource Strain: Not on file  Food Insecurity: Not on file  Transportation Needs: Not on file  Physical Activity: Not on file  Stress: Not on file  Social Connections: Not on file  Intimate Partner Violence: Not on file    Allergies  Allergen Reactions  . Amoxicillin     At end of visit with patient when asked to continue Amoxicillin she reports she may have had a reaction to " this years ago" She does not know what type of reaction. Denies shortness of breath, hives or throat swelling .   Marland Kitchen Lidocaine Other (See Comments)    "fast heart beat, dizziness"    Prior to Admission medications   Medication Sig Start Date End Date Taking? Authorizing Provider  SYNTHROID 50 MCG tablet TAKE 1 TABLET (50 MCG TOTAL) BY MOUTH EVERY OTHER DAY. ALTERNATIVE DAYS WITH 75 MCG DOSE 01/10/20  Yes Will Bonnet, MD  levothyroxine (SYNTHROID, LEVOTHROID) 75 MCG tablet Take by mouth. Patient not taking: Reported on 07/22/2020 09/20/16   [provider]  phenazopyridine (PYRIDIUM) 200 MG tablet Take 1 tablet (200 mg total) by mouth 3 (three) times daily as needed for pain. Patient not taking: No sig reported 04/20/18   Zara Council A, PA-C    Review of Systems  Constitutional: Positive for malaise/fatigue. Negative  for chills, diaphoresis, fever and weight loss.       +weight gain  HENT: Negative.   Eyes: Negative.   Respiratory: Negative.   Cardiovascular: Negative.   Gastrointestinal: Positive for abdominal pain (RLQ pain, per HPI). Negative for blood in stool, constipation, diarrhea, heartburn, melena, nausea and vomiting.  Genitourinary: Negative.   Musculoskeletal: Negative.   Skin: Negative.        See HPI regarding breasts  Neurological: Negative.   Psychiatric/Behavioral: Negative.      Physical Exam BP 126/70   Ht 5' (1.524 m)   Wt 148 lb (67.1 kg)   LMP 07/15/2020   BMI 28.90 kg/m  Patient's last menstrual  period was 07/15/2020. Physical Exam Constitutional:      General: She is not in acute distress.    Appearance: Normal appearance.  Genitourinary:  Breasts:     Tanner Score is 5.     Right: No swelling, bleeding, inverted nipple, mass, nipple discharge, skin change, tenderness, axillary adenopathy or supraclavicular adenopathy.     Left: No swelling, bleeding, inverted nipple, mass, nipple discharge, skin change, tenderness, axillary adenopathy or supraclavicular adenopathy.    HENT:     Head: Normocephalic and atraumatic.  Eyes:     General: No scleral icterus.    Conjunctiva/sclera: Conjunctivae normal.  Abdominal:     General: There is no distension.     Palpations: Abdomen is soft. There is no mass.     Tenderness: There is no abdominal tenderness. There is no guarding or rebound.     Hernia: No hernia is present.  Lymphadenopathy:     Upper Body:     Right upper body: No supraclavicular or axillary adenopathy.     Left upper body: No supraclavicular or axillary adenopathy.  Neurological:     General: No focal deficit present.     Mental Status: She is alert and oriented to person, place, and time.     Cranial Nerves: No cranial nerve deficit.  Psychiatric:        Mood and Affect: Mood normal.        Behavior: Behavior normal.        Judgment: Judgment normal.     Female chaperone present for pelvic and breast  portions of the physical exam  Assessment: 45 y.o. G62P2002 female here for  1. Mastalgia   2. Acquired hypothyroidism   3. Fatigue, unspecified type      Plan: Problem List Items Addressed This Visit   None   Visit Diagnoses    Mastalgia    -  Primary   Acquired hypothyroidism       Relevant Orders   TSH   Fatigue, unspecified type       Relevant Orders   CBC with Differential/Platelet   TSH     Mastalgia: reassured patient. I did offer diagnostic imaging. However, she is comfortable waiting until June to repeat her imaging.   Fatigue: will  check TSH and CBC.  Will make decisions based on these results RLQ pain: continue to monitor. Gave precautions for severe pain, fever, anorexia, nausea, vomiting. She will let me know, if any of this develops. We discussed the multiple possible etiologies for her pain.  A total of 30 minutes were spent face-to-face with the patient as well as preparation, review, communication, and documentation during this encounter.    Prentice Docker, MD 07/22/2020 1:15 PM

## 2020-07-23 LAB — CBC WITH DIFFERENTIAL/PLATELET
Basophils Absolute: 0.1 10*3/uL (ref 0.0–0.2)
Basos: 1 %
EOS (ABSOLUTE): 0.1 10*3/uL (ref 0.0–0.4)
Eos: 2 %
Hematocrit: 37.9 % (ref 34.0–46.6)
Hemoglobin: 12.5 g/dL (ref 11.1–15.9)
Immature Grans (Abs): 0 10*3/uL (ref 0.0–0.1)
Immature Granulocytes: 0 %
Lymphocytes Absolute: 1.2 10*3/uL (ref 0.7–3.1)
Lymphs: 21 %
MCH: 28.1 pg (ref 26.6–33.0)
MCHC: 33 g/dL (ref 31.5–35.7)
MCV: 85 fL (ref 79–97)
Monocytes Absolute: 0.7 10*3/uL (ref 0.1–0.9)
Monocytes: 11 %
Neutrophils Absolute: 3.9 10*3/uL (ref 1.4–7.0)
Neutrophils: 65 %
Platelets: 222 10*3/uL (ref 150–450)
RBC: 4.45 x10E6/uL (ref 3.77–5.28)
RDW: 13.2 % (ref 11.7–15.4)
WBC: 6 10*3/uL (ref 3.4–10.8)

## 2020-07-23 LAB — TSH: TSH: 3 u[IU]/mL (ref 0.450–4.500)

## 2020-09-18 ENCOUNTER — Other Ambulatory Visit: Payer: Self-pay

## 2020-09-18 ENCOUNTER — Ambulatory Visit
Admission: RE | Admit: 2020-09-18 | Discharge: 2020-09-18 | Disposition: A | Discharge status: Home / Self Care | Payer: BC Managed Care – PPO | Source: Ambulatory Visit | Attending: Family Medicine | Admitting: Family Medicine

## 2020-09-18 VITALS — BP 138/94 | HR 100 | Temp 98.6°F | Resp 16

## 2020-09-18 DIAGNOSIS — D1800 Hemangioma unspecified site: Secondary | ICD-10-CM

## 2020-09-18 DIAGNOSIS — R519 Headache, unspecified: Secondary | ICD-10-CM

## 2020-09-18 DIAGNOSIS — R42 Dizziness and giddiness: Secondary | ICD-10-CM | POA: Diagnosis not present

## 2020-09-18 DIAGNOSIS — R110 Nausea: Secondary | ICD-10-CM

## 2020-09-18 MED ORDER — ONDANSETRON 4 MG PO TBDP
4.0000 mg | ORAL_TABLET | Freq: Three times a day (TID) | ORAL | 0 refills | Status: DC | PRN
Start: 2020-09-18 — End: 2022-07-03

## 2020-09-18 MED ORDER — MECLIZINE HCL 12.5 MG PO TABS: 12.5000 mg | ORAL_TABLET | Freq: Three times a day (TID) | ORAL | 0 refills | Status: DC | PRN

## 2020-09-18 NOTE — ED Provider Notes (Signed)
[name]    CSN: [id number] Arrival date & time: 09/18/20  1436      History   Chief Complaint Chief Complaint  Patient presents with  . Appointment    3:00  . Dizziness    HPI Yesenia Moore is a 45 y.o. female.   Patient is a 45 year old female with past medical history of thyroid disease, diabetes, anemia, cavernoma.  She presents today with sudden onset dizziness, nausea, headache that started Sunday.  Reporting that aching is in the back of her head and some associated numbness in her occipital area.  The vertigo is worse when she is laying down and worsened last night and was unable to sleep.  She went to see a chiropractor thinking this was muscle spasming but that made her symptoms worse.  Denies any vision changes.  Was seen a few years ago for similar symptoms and at that time her cavernoma was bleeding.   Dizziness   Past Medical History:  Diagnosis Date  . Anemia   . Diabetes mellitus without complication (HCC)    Gestational diabetes  . Hashimoto's thyroiditis   . Thyroid disease     Patient Active Problem List   Diagnosis Date Noted  . Chronic endometritis 02/23/2018  . Menorrhagia with irregular cycle 02/14/2018  . Noncompliance with medications 10/06/2017  . Noncompliance by refusing intervention or support 10/06/2017  . Allergic rhinitis 10/06/2017  . Sorethroat 10/06/2017  . Proteinuria 12/21/2016  . S/P cesarean section 03/27/2015  . Hashimoto's thyroiditis 05/12/2014    Past Surgical History:  Procedure Laterality Date  . CESAREAN SECTION    . CESAREAN SECTION N/A 03/27/2015   Procedure: CESAREAN SECTION;  Surgeon: Will Bonnet, MD;  Location: [facility] ORS;  Service: Obstetrics;  Laterality: N/A;  . CESAREAN SECTION      OB History    Gravida  2   Para  2   Term  2   Preterm      AB      Living  2     SAB      IAB      Ectopic      Multiple  0   Live Births  2            Home Medications    Prior  to Admission medications   Medication Sig Start Date End Date Taking? Authorizing Provider  meclizine (ANTIVERT) 12.5 MG tablet Take 1 tablet (12.5 mg total) by mouth 3 (three) times daily as needed for dizziness. 09/18/20  Yes Johniya Durfee A, NP  ondansetron (ZOFRAN ODT) 4 MG disintegrating tablet Take 1 tablet (4 mg total) by mouth every 8 (eight) hours as needed for nausea or vomiting. 09/18/20  Yes Zareena Willis A, NP  SYNTHROID 50 MCG tablet TAKE 1 TABLET (50 MCG TOTAL) BY MOUTH EVERY OTHER DAY. ALTERNATIVE DAYS WITH 75 MCG DOSE 01/10/20  Yes Will Bonnet, MD    Family History History reviewed. No pertinent family history.  Social History Social History   Tobacco Use  . Smoking status: Never Smoker  . Smokeless tobacco: Never Used  Vaping Use  . Vaping Use: Never used  Substance Use Topics  . Alcohol use: Yes    Alcohol/week: 2.0 standard drinks    Types: 2 Glasses of wine per week    Comment: occasional  . Drug use: No     Allergies   Amoxicillin and Lidocaine   Review of Systems Review of Systems  Neurological: Positive  for dizziness.     Physical Exam Triage Vital Signs ED Triage Vitals  Enc Vitals Group     BP 09/18/20 1455 (!) 138/94     Pulse Rate 09/18/20 1455 100     Resp 09/18/20 1455 16     Temp 09/18/20 1455 98.6 F (37 C)     Temp Source 09/18/20 1455 Oral     SpO2 09/18/20 1455 98 %     Weight --      Height --      Head Circumference --      Peak Flow --      Pain Score 09/18/20 1452 0     Pain Loc --      Pain Edu? --      Excl. in [place]? --    No data found.  Updated Vital Signs BP (!) 138/94 (BP Location: Right Arm)   Pulse 100   Temp 98.6 F (37 C) (Oral)   Resp 16   SpO2 98%   Visual Acuity Right Eye Distance:   Left Eye Distance:   Bilateral Distance:    Right Eye Near:   Left Eye Near:    Bilateral Near:     Physical Exam Vitals and nursing note reviewed.  Constitutional:      General: She is not in acute  distress.    Appearance: Normal appearance. She is not ill-appearing, toxic-appearing or diaphoretic.  HENT:     Head: Normocephalic.     Nose: Nose normal.     Mouth/Throat:     Pharynx: Oropharynx is clear.  Eyes:     Extraocular Movements: Extraocular movements intact.     Conjunctiva/sclera: Conjunctivae normal.     Pupils: Pupils are equal, round, and reactive to light.  Pulmonary:     Effort: Pulmonary effort is normal.  Musculoskeletal:        General: Normal range of motion.     Cervical back: Normal range of motion.  Skin:    General: Skin is warm and dry.     Findings: No rash.  Neurological:     General: No focal deficit present.     Mental Status: She is alert.     Motor: No weakness.     Gait: Gait normal.  Psychiatric:        Mood and Affect: Mood normal.        Behavior: Behavior normal.      UC Treatments / Results  Labs (all labs ordered are listed, but only abnormal results are displayed) Labs Reviewed - No data to display  EKG   Radiology No results found.  Procedures Procedures (including critical care time)  Medications Ordered in UC Medications - No data to display  Initial Impression / Assessment and Plan / UC Course  I have reviewed the triage vital signs and the nursing notes.  Pertinent labs & imaging results that were available during my care of the patient were reviewed by me and considered in my medical decision making (see chart for details).     Vertigo with headache and nausea.  Patient with past medical history of cavernoma that has had small bleeding in the past.  At that time she did have some vertigo type symptoms.  Nothing concerning on exam today but there is some worry that her cavernoma is bleeding again due to her symptoms and her symptoms have worsened. She is very anxious about situation I recommended go to the ER for MRI to get a  further look at her brain and cavernoma. I have prescribed a medicine sees as needed  for dizziness and nausea upon discharge from the hospital Patient and husband agreed  Final Clinical Impressions(s) / UC Diagnoses   Final diagnoses:  Vertigo  Acute nonintractable headache, unspecified headache type  Cavernoma  Nausea without vomiting     Discharge Instructions     I am concerned that your cavernoma is bleeding. I believe that you need an MRI of the brain.  Please go to the ER.  I will send some medicines in for nausea and dizziness to use if needed.  Follow up as needed for continued or worsening symptoms     ED Prescriptions    Medication Sig Dispense Auth. Provider   ondansetron (ZOFRAN ODT) 4 MG disintegrating tablet Take 1 tablet (4 mg total) by mouth every 8 (eight) hours as needed for nausea or vomiting. 20 tablet Makaia Rappa A, NP   meclizine (ANTIVERT) 12.5 MG tablet Take 1 tablet (12.5 mg total) by mouth 3 (three) times daily as needed for dizziness. 30 tablet Loura Halt A, NP     PDMP not reviewed this encounter.   Orvan July, NP 09/18/20 1538

## 2020-09-18 NOTE — Discharge Instructions (Signed)
I am concerned that your cavernoma is bleeding. I believe that you need an MRI of the brain.  Please go to the ER.  I will send some medicines in for nausea and dizziness to use if needed.  Follow up as needed for continued or worsening symptoms

## 2020-09-18 NOTE — ED Triage Notes (Addendum)
Dizziness started on Sunday.  Complains of nausea.  Numbness on back of head and weak feeling.  Patient has had this issue before (approx 3 years ago) had extensive work up, but no diagnosis.  Unsure medicine prescribed, but says she did not take secondary to side effects.

## 2020-09-21 ENCOUNTER — Emergency Department: Payer: BC Managed Care – PPO

## 2020-09-21 ENCOUNTER — Emergency Department
Admission: EM | Admit: 2020-09-21 | Discharge: 2020-09-21 | Disposition: A | Discharge status: Home / Self Care | Payer: BC Managed Care – PPO | Attending: Emergency Medicine | Admitting: Emergency Medicine

## 2020-09-21 ENCOUNTER — Encounter: Payer: Self-pay | Admitting: Intensive Care

## 2020-09-21 ENCOUNTER — Other Ambulatory Visit: Payer: Self-pay

## 2020-09-21 DIAGNOSIS — R110 Nausea: Secondary | ICD-10-CM | POA: Diagnosis not present

## 2020-09-21 DIAGNOSIS — H538 Other visual disturbances: Secondary | ICD-10-CM | POA: Diagnosis not present

## 2020-09-21 DIAGNOSIS — E119 Type 2 diabetes mellitus without complications: Secondary | ICD-10-CM | POA: Insufficient documentation

## 2020-09-21 DIAGNOSIS — R42 Dizziness and giddiness: Secondary | ICD-10-CM | POA: Diagnosis not present

## 2020-09-21 DIAGNOSIS — R519 Headache, unspecified: Secondary | ICD-10-CM | POA: Diagnosis not present

## 2020-09-21 LAB — DIFFERENTIAL
Abs Immature Granulocytes: 0.02 10*3/uL (ref 0.00–0.07)
Basophils Absolute: 0.1 10*3/uL (ref 0.0–0.1)
Basophils Relative: 1 %
Eosinophils Absolute: 0.1 10*3/uL (ref 0.0–0.5)
Eosinophils Relative: 1 %
Immature Granulocytes: 0 %
Lymphocytes Relative: 20 %
Lymphs Abs: 1.4 10*3/uL (ref 0.7–4.0)
Monocytes Absolute: 0.8 10*3/uL (ref 0.1–1.0)
Monocytes Relative: 11 %
Neutro Abs: 4.8 10*3/uL (ref 1.7–7.7)
Neutrophils Relative %: 67 %

## 2020-09-21 LAB — COMPREHENSIVE METABOLIC PANEL
ALT: 13 U/L (ref 0–44)
AST: 15 U/L (ref 15–41)
Albumin: 4.1 g/dL (ref 3.5–5.0)
Alkaline Phosphatase: 42 U/L (ref 38–126)
Anion gap: 9 (ref 5–15)
BUN: 12 mg/dL (ref 6–20)
CO2: 24 mmol/L (ref 22–32)
Calcium: 9.2 mg/dL (ref 8.9–10.3)
Chloride: 106 mmol/L (ref 98–111)
Creatinine, Ser: 0.73 mg/dL (ref 0.44–1.00)
GFR, Estimated: >60 mL/min (ref >60)
Glucose, Bld: 112 mg/dL — ABNORMAL HIGH (ref 70–99)
Potassium: 3.9 mmol/L (ref 3.5–5.1)
Sodium: 139 mmol/L (ref 135–145)
Total Bilirubin: 0.6 mg/dL (ref 0.3–1.2)
Total Protein: 7.1 g/dL (ref 6.5–8.1)

## 2020-09-21 LAB — CBC
HCT: 40.1 % (ref 36.0–46.0)
Hemoglobin: 13.1 g/dL (ref 12.0–15.0)
MCH: 27.8 pg (ref 26.0–34.0)
MCHC: 32.7 g/dL (ref 30.0–36.0)
MCV: 85 fL (ref 80.0–100.0)
Platelets: 219 10*3/uL (ref 150–400)
RBC: 4.72 MIL/uL (ref 3.87–5.11)
RDW: 13 % (ref 11.5–15.5)
WBC: 7.1 10*3/uL (ref 4.0–10.5)
nRBC: 0 % (ref 0.0–0.2)

## 2020-09-21 LAB — APTT: aPTT: 32 seconds (ref 24–36)

## 2020-09-21 LAB — HCG, QUANTITATIVE, PREGNANCY: hCG, Beta Chain, Quant, S: <1 m[IU]/mL (ref <5)

## 2020-09-21 LAB — PROTIME-INR
INR: 1.1 (ref 0.8–1.2)
Prothrombin Time: 13.3 seconds (ref 11.4–15.2)

## 2020-09-21 MED ORDER — HYDROXYZINE HCL 25 MG PO TABS: 25.0000 mg | ORAL_TABLET | Freq: Three times a day (TID) | ORAL | 0 refills | Status: DC | PRN

## 2020-09-21 MED ORDER — GADOBUTROL 1 MMOL/ML IV SOLN
6.0000 mL | Freq: Once | INTRAVENOUS | Status: AC | PRN
  Administered 2020-09-21: 6 mL via INTRAVENOUS

## 2020-09-21 NOTE — ED Provider Notes (Signed)
[facility] Emergency Department Provider Note  ____________________________________________   Event Date/Time   First MD Initiated Contact with Patient 09/21/20 1340     (approximate)  I have reviewed the triage vital signs and the nursing notes.   HISTORY  Chief Complaint Dizziness and Numbness   HPI Yesenia Moore is a 45 y.o. 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  She does endorses a little bit of nausea and had an episode of diarrhea couple days ago but is not sure exactly when.  She states that her vertigo is slightly better when she is moving or exercising but that if she lays back quickly will get worse.  She states this feels similar to when she had her cavernoma diagnosed a couple years ago.  She is not on any blood thinners.  No recent trauma.  No fevers, chills, sore throat, vomiting, chest pain, cough, shortness of breath, abdominal pain, rash, urinary symptoms or acute extremity pain or local weakness numbness or tingling.         Past Medical History:  Diagnosis Date  . Anemia   . Diabetes mellitus without complication (HCC)    Gestational diabetes  . Hashimoto's thyroiditis   . Thyroid disease     Patient Active Problem List   Diagnosis Date Noted  . Chronic endometritis 02/23/2018  . Menorrhagia with irregular cycle 02/14/2018  . Noncompliance with medications 10/06/2017  . Noncompliance by refusing intervention or support 10/06/2017  . Allergic rhinitis 10/06/2017  . Sorethroat 10/06/2017  . Proteinuria 12/21/2016  . S/P cesarean section 03/27/2015  . Hashimoto's thyroiditis 05/12/2014    Past Surgical History:   Procedure Laterality Date  . CESAREAN SECTION    . CESAREAN SECTION N/A 03/27/2015   Procedure: CESAREAN SECTION;  Surgeon: Will Bonnet, MD;  Location: [facility] ORS;  Service: Obstetrics;  Laterality: N/A;  . CESAREAN SECTION      Prior to Admission medications   Medication Sig Start Date End Date Taking? Authorizing Provider  meclizine (ANTIVERT) 12.5 MG tablet Take 1 tablet (12.5 mg total) by mouth 3 (three) times daily as needed for dizziness. 09/18/20   Loura Halt A, NP  ondansetron (ZOFRAN ODT) 4 MG disintegrating tablet Take 1 tablet (4 mg total) by mouth every 8 (eight) hours as needed for nausea or vomiting. 09/18/20   Bast, Traci A, NP  SYNTHROID 50 MCG tablet TAKE 1 TABLET (50 MCG TOTAL) BY MOUTH EVERY OTHER DAY. ALTERNATIVE DAYS WITH 75 MCG DOSE 01/10/20   Will Bonnet, MD    Allergies Amoxicillin and Lidocaine  History reviewed. No pertinent family history.  Social History Social History   Tobacco Use  . Smoking status: Never Smoker  . Smokeless tobacco: Never Used  Vaping Use  . Vaping Use: Never used  Substance Use Topics  . Alcohol use: Yes    Alcohol/week: 2.0 standard drinks    Types: 2 Glasses of wine per week    Comment: occasional  . Drug use: No    Review of Systems  Review of Systems  Constitutional: Positive for malaise/fatigue. Negative for chills and fever.  HENT: Negative for sore throat.   Eyes: Negative for pain.  Respiratory: Negative for cough and stridor.  Cardiovascular: Negative for chest pain.  Gastrointestinal: Positive for nausea.  Genitourinary: Negative for dysuria.  Musculoskeletal: Negative for myalgias.  Skin: Negative for rash.  Neurological: Positive for dizziness and headaches. Negative for seizures and loss of consciousness.  Psychiatric/Behavioral: Negative for suicidal ideas.  All other systems reviewed and are negative.     ____________________________________________   PHYSICAL EXAM:  VITAL SIGNS: ED  Triage Vitals  Enc Vitals Group     BP 09/21/20 1220 (!) 139/94     Pulse Rate 09/21/20 1220 (!) 105     Resp 09/21/20 1220 16     Temp 09/21/20 1220 98.9 F (37.2 C)     Temp Source 09/21/20 1220 Oral     SpO2 09/21/20 1220 99 %     Weight 09/21/20 1222 138 lb (62.6 kg)     Height 09/21/20 1222 5\' 5"  (1.651 m)     Head Circumference --      Peak Flow --      Pain Score 09/21/20 1222 8     Pain Loc --      Pain Edu? --      Excl. in [place]? --    Vitals:   09/21/20 1400 09/21/20 1440  BP: (!) 118/94   Pulse: 91 83  Resp: 18 18  Temp:    SpO2: 99% 100%   Physical Exam Vitals and nursing note reviewed.  Constitutional:      General: She is not in acute distress.    Appearance: She is well-developed.  HENT:     Head: Normocephalic and atraumatic.     Right Ear: External ear normal.     Left Ear: External ear normal.     Nose: Nose normal.  Eyes:     Conjunctiva/sclera: Conjunctivae normal.  Cardiovascular:     Rate and Rhythm: Normal rate and regular rhythm.     Heart sounds: No murmur heard.   Pulmonary:     Effort: Pulmonary effort is normal. No respiratory distress.     Breath sounds: Normal breath sounds.  Abdominal:     Palpations: Abdomen is soft.     Tenderness: There is no abdominal tenderness.  Musculoskeletal:     Cervical back: Neck supple.  Skin:    General: Skin is warm and dry.     Capillary Refill: Capillary refill takes less than 2 seconds.  Neurological:     Mental Status: She is alert and oriented to person, place, and time.  Psychiatric:        Mood and Affect: Mood normal.      ____________________________________________   LABS (all labs ordered are listed, but only abnormal results are displayed)  Labs Reviewed  COMPREHENSIVE METABOLIC PANEL - Abnormal; Notable for the following components:      Result Value   Glucose, Bld 112 (*)    All other components within normal limits  PROTIME-INR  APTT  CBC  DIFFERENTIAL  HCG,  QUANTITATIVE, PREGNANCY  POC URINE PREG, ED   ____________________________________________  EKG  Sinus rhythm ventricular rate of 98, normal axis, unremarkable intervals and no clear evidence of acute ischemia or other significant underlying arrhythmia. ____________________________________________  RADIOLOGY  ED MD interpretation:    Official radiology report(s): No results found.  ____________________________________________   PROCEDURES  Procedure(s) performed (including Critical Care):  .1-3 Lead EKG Interpretation Performed by: Lucrezia Starch, MD Authorized by: Lucrezia Starch, MD     Interpretation: normal     ECG rate assessment: normal  Rhythm: sinus rhythm     Ectopy: none     Conduction: normal       ____________________________________________   INITIAL IMPRESSION / ASSESSMENT AND PLAN / ED COURSE      Patient presents with above to history exam for assessment of some dizziness vertigo and pressure as well as intermittent blurry vision over the last week.  Is in the setting of known diagnosis of previous hemorrhagic cavernoma.  On arrival patient is afebrile and hemodynamically stable.  She has a nonfocal neuro exam and her face head scalp neck and oropharynx are unremarkable.  Differential includes acute hemorrhage into cavernoma versus other acute cranial hemorrhage or CVA, metabolic derangements, and peripheral vertigo.  No history exam findings to suggest acute traumatic process or acute infectious process.  Low suspicion for toxic ingestion.  ECG in triage is unremarkable.  CMP shows no significant electrolyte or metabolic derangements.  CBC shows no significant leukocytosis or anemia.  PT and INR are unremarkable.   Given history of hemorrhagic cavernoma will obtain CT Noncon but if this is negative patient will likely require MR brain with and without.  Care patient signed over to oncoming provider approximately 1500.  Plan to follow-up CT  and if is unremarkable obtain MR brain and reassess.  Patient declined any antiemetic or headache cocktail on initial assessment.     ____________________________________________   FINAL CLINICAL IMPRESSION(S) / ED DIAGNOSES  Final diagnoses:  Vertigo  Nonintractable headache, unspecified chronicity pattern, unspecified headache type    Medications - No data to display   ED Discharge Orders    None       Note:  This document was prepared using Dragon voice recognition software and may include unintentional dictation errors.   Lucrezia Starch, MD 09/21/20 [phone]

## 2020-09-21 NOTE — ED Provider Notes (Signed)
-----------------------------------------   4:13 PM on 09/21/2020 -----------------------------------------  Patient care assumed from Dr. Hulan Saas.  Lab work largely within normal limits.  CT scan shows no definitive evidence of a bleed but recommend MRI to further evaluate.  Spoke to the patient and husband who are agreeable.  We will obtain an MRI of the brain with and without to further evaluate.  MRI shows stable appearance of the left pons with minimal enhancement.  No acute abnormalities noted.   Harvest Dark, MD 09/21/20 [phone]

## 2020-09-21 NOTE — ED Triage Notes (Signed)
Patient c/o dizziness,blurry vision, back head numbness, pressure in forehead, and nausea since last Sunday. C/o weakness. Hx same and reports MRI x3 years ago. Ambulatory into ER with no problems. Denies any injury

## 2020-09-21 NOTE — ED Notes (Signed)
Patient refused CT scan for blurry vision and head pressure. Reports she only wants MRI

## 2020-09-21 NOTE — ED Notes (Addendum)
Pt c/o dizziness and vertigo that is worse laying down, turning head, or standing up quickly, and that s/s improve when she is walking. Pt c/o numbness and slight pain/pressure on back of neck. Pt also reports "pressure in eyes and blurry vision earlier today." Pt reports the sensation of spinning when eyes are closed as well.

## 2020-09-21 NOTE — ED Notes (Signed)
Lights dimmed for comfort, husband at bedside.

## 2020-09-21 NOTE — ED Notes (Signed)
Pt spoke with MRI by phone.

## 2020-09-21 NOTE — ED Notes (Signed)
Pt back from CT

## 2020-09-21 NOTE — ED Notes (Signed)
Pt back from MRI 

## 2021-01-29 ENCOUNTER — Other Ambulatory Visit: Payer: Self-pay | Admitting: Internal Medicine

## 2021-01-29 DIAGNOSIS — Z1231 Encounter for screening mammogram for malignant neoplasm of breast: Secondary | ICD-10-CM

## 2021-02-04 ENCOUNTER — Telehealth: Payer: Self-pay

## 2021-02-04 NOTE — Telephone Encounter (Signed)
Pt calling; has inf; used monistat; didn't help; can't do appt c [facility] d/t no availability.  [phone]  Pt states sxs are d/c, itchy, burning, very uncomfortable; 3d monistat didn't help.  Adv we have other providers that can see her for this; pt sched for tomorrow am c [facility].

## 2021-02-05 ENCOUNTER — Other Ambulatory Visit: Payer: Self-pay

## 2021-02-05 ENCOUNTER — Ambulatory Visit: Payer: BC Managed Care – PPO | Admitting: Obstetrics

## 2021-03-02 ENCOUNTER — Ambulatory Visit: Payer: BC Managed Care – PPO | Admitting: Obstetrics and Gynecology

## 2021-04-24 ENCOUNTER — Encounter: Payer: Self-pay | Admitting: Obstetrics and Gynecology

## 2021-04-24 ENCOUNTER — Encounter: Payer: BC Managed Care – PPO | Admitting: Obstetrics and Gynecology

## 2021-04-24 ENCOUNTER — Other Ambulatory Visit: Payer: Self-pay

## 2021-04-24 ENCOUNTER — Ambulatory Visit (INDEPENDENT_AMBULATORY_CARE_PROVIDER_SITE_OTHER): Payer: BC Managed Care – PPO | Admitting: Obstetrics and Gynecology

## 2021-04-24 VITALS — BP 126/84 | Ht 64.0 in | Wt 149.0 lb

## 2021-04-24 DIAGNOSIS — Z1339 Encounter for screening examination for other mental health and behavioral disorders: Secondary | ICD-10-CM

## 2021-04-24 DIAGNOSIS — Z01419 Encounter for gynecological examination (general) (routine) without abnormal findings: Secondary | ICD-10-CM | POA: Diagnosis not present

## 2021-04-24 DIAGNOSIS — Z1331 Encounter for screening for depression: Secondary | ICD-10-CM | POA: Diagnosis not present

## 2021-04-24 DIAGNOSIS — R3989 Other symptoms and signs involving the genitourinary system: Secondary | ICD-10-CM | POA: Diagnosis not present

## 2021-04-24 NOTE — Patient Instructions (Signed)
[facility] [place], [place] Address: [facility], [address] [place], [place] [address] Phone: [phone]

## 2021-04-24 NOTE — Progress Notes (Signed)
Gynecology Annual Exam   PCP: Tracie Harrier, MD  Chief Complaint  Patient presents with   Annual Exam   History of Present Illness:  Ms. Yesenia Moore is a 45 y.o. [id number] who LMP was Patient's last menstrual period was 04/17/2021 (approximate)., presents today for her annual examination.  Her menses are regular every 28-30 days, lasting 2 day(s).  Dysmenorrhea none. She has some brownish discharge about 1 week prior to her periods. The amount is not a lot. She continues to have some lower abdominal pain, especially in her left lower quadrant.     She is sexually active. She  Uses condoms for birth control.   Last Pap: 2019  Results were: no abnormalities /neg HPV DNA negative Hx of STDs: none  Last mammogram: 12/13/2019, BiRads 1 There is no FH of breast cancer. There is no FH of ovarian cancer. The patient does do self-breast exams.  Colonoscopy: never had DEXA: has not been screened for osteoporosis  Tobacco use: The patient denies current or previous tobacco use. Alcohol use: social drinker Exercise: 2-3 times per week  The patient wears seatbelts: yes.     Her PCP is managing her thyroid medication. She is currently taking synthroid 50 mcg.   She recently had a hemoglobin A1c of 5.8.    Past Medical History:  Diagnosis Date   Anemia    Diabetes mellitus without complication ([place])    Gestational diabetes   Hashimoto's thyroiditis    Thyroid disease    Past Surgical History:  Procedure Laterality Date   CESAREAN SECTION     CESAREAN SECTION N/A 03/27/2015   Procedure: CESAREAN SECTION;  Surgeon: Will Bonnet, MD;  Location: [facility] ORS;  Service: Obstetrics;  Laterality: N/A;   CESAREAN SECTION      Prior to Admission medications   Medication Sig Start Date End Date Taking? Authorizing Provider  SYNTHROID 50 MCG tablet TAKE 1 TABLET (50 MCG TOTAL) BY MOUTH EVERY OTHER DAY. ALTERNATIVE DAYS WITH 75 MCG DOSE 01/10/20  Yes Will Bonnet, MD      Allergies  Allergen Reactions   Amoxicillin     At end of visit with patient when asked to continue Amoxicillin she reports she may have had a reaction to " this years ago" She does not know what type of reaction. Denies shortness of breath, hives or throat swelling .    Lidocaine Other (See Comments)    "fast heart beat, dizziness"   Obstetric History: [id number]  Family History  Problem Relation Age of Onset   Breast cancer Neg Hx    Ovarian cancer Neg Hx     Social History   Socioeconomic History   Marital status: Married    Spouse name: Not on file   Number of children: 2   Years of education: Not on file   Highest education level: Not on file  Occupational History   Not on file  Tobacco Use   Smoking status: Never   Smokeless tobacco: Never  Vaping Use   Vaping Use: Never used  Substance and Sexual Activity   Alcohol use: Yes    Alcohol/week: 2.0 standard drinks    Types: 2 Glasses of wine per week    Comment: occasional   Drug use: No   Sexual activity: Yes    Birth control/protection: Condom  Other Topics Concern   Not on file  Social History Narrative   Not on file   Social Determinants of Health  Financial Resource Strain: Not on file  Food Insecurity: Not on file  Transportation Needs: Not on file  Physical Activity: Not on file  Stress: Not on file  Social Connections: Not on file  Intimate Partner Violence: Not on file    Review of Systems  Constitutional: Negative.   HENT: Negative.    Eyes: Negative.   Respiratory: Negative.    Cardiovascular: Negative.   Gastrointestinal: Negative.   Genitourinary: Negative.   Musculoskeletal: Negative.   Skin: Negative.   Neurological: Negative.   Psychiatric/Behavioral: Negative.      Physical Exam BP 126/84   Ht 5\' 4"  (1.626 m)   Wt 149 lb (67.6 kg)   LMP 04/17/2021 (Approximate)   BMI 25.58 kg/m   Physical Exam Constitutional:      General: She is not in acute distress.    Appearance:  Normal appearance. She is well-developed.  Genitourinary:     Vulva and bladder normal.     No vaginal discharge, erythema, tenderness or bleeding.      Right Adnexa: not tender, not full and no mass present.    Left Adnexa: not tender, not full and no mass present.    No cervical motion tenderness, discharge, friability, lesion or polyp.     Uterus is not enlarged or tender.     No uterine mass detected.    Pelvic exam was performed with patient in the lithotomy position.  Breasts:    Right: No swelling, bleeding, inverted nipple, mass, nipple discharge, skin change or tenderness.     Left: No swelling, bleeding, inverted nipple, mass, nipple discharge, skin change or tenderness.  HENT:     Head: Normocephalic and atraumatic.  Eyes:     General: No scleral icterus.    Conjunctiva/sclera: Conjunctivae normal.  Neck:     Thyroid: No thyromegaly.  Cardiovascular:     Rate and Rhythm: Normal rate and regular rhythm.     Heart sounds: No murmur heard.   No friction rub. No gallop.  Pulmonary:     Effort: Pulmonary effort is normal. No respiratory distress.     Breath sounds: Normal breath sounds. No wheezing or rales.  Abdominal:     General: Bowel sounds are normal. There is no distension.     Palpations: Abdomen is soft. There is no mass.     Tenderness: There is no abdominal tenderness. There is no guarding or rebound.  Musculoskeletal:        General: No swelling or tenderness. Normal range of motion.     Cervical back: Normal range of motion and neck supple.  Lymphadenopathy:     Cervical: No cervical adenopathy.     Upper Body:     Right upper body: No supraclavicular or axillary adenopathy.     Left upper body: No supraclavicular or axillary adenopathy.     Lower Body: No right inguinal adenopathy. No left inguinal adenopathy.  Neurological:     General: No focal deficit present.     Mental Status: She is alert and oriented to person, place, and time.     Cranial  Nerves: No cranial nerve deficit.  Skin:    General: Skin is warm and dry.     Findings: No erythema or rash.  Psychiatric:        Mood and Affect: Mood normal.        Behavior: Behavior normal.        Judgment: Judgment normal.   Female chaperone present for pelvic and  breast  portions of the physical exam  Results: AUDIT Questionnaire (screen for alcoholism): 2 PHQ-9: 1  Assessment: 44 y.o. [id number] female here for routine gynecologic examination.  Plan: Problem List Items Addressed This Visit   None Visit Diagnoses     Women's annual routine gynecological examination    -  Primary   Screening for depression       Screening for alcoholism       Abnormal urogenital discharge       Relevant Orders   NuSwab Vaginitis Plus (VG+)      Screening: -- Blood pressure screen normal -- Colonoscopy - not due -- Mammogram - due. Patient to call [place] to arrange. She understands that it is her responsibility to arrange this. -- Weight screening: normal -- Depression screening negative (PHQ-9) -- Nutrition: normal -- cholesterol screening: not due for screening -- osteoporosis screening: not due -- tobacco screening: not using -- alcohol screening: AUDIT questionnaire indicates low-risk usage. -- family history of breast cancer screening: done. not at high risk. -- no evidence of domestic violence or intimate partner violence. -- STD screening: gonorrhea/chlamydia NAAT not collected per patient request. -- pap smear not collected per ASCCP guidelines  Prentice Docker, MD 12/06/2019 9:53 AM

## 2021-04-27 NOTE — Progress Notes (Signed)
Error

## 2021-04-29 LAB — NUSWAB VAGINITIS PLUS (VG+)
Candida albicans, NAA: NEGATIVE
Candida glabrata, NAA: NEGATIVE
Chlamydia trachomatis, NAA: NEGATIVE
Neisseria gonorrhoeae, NAA: NEGATIVE
Trich vag by NAA: NEGATIVE

## 2021-05-05 ENCOUNTER — Other Ambulatory Visit: Payer: Self-pay

## 2021-05-05 ENCOUNTER — Encounter: Payer: Self-pay | Admitting: Medical

## 2021-05-05 ENCOUNTER — Ambulatory Visit: Payer: BC Managed Care – PPO | Admitting: Medical

## 2021-05-05 VITALS — BP 132/82 | HR 94 | Temp 98.6°F | Resp 16

## 2021-05-05 DIAGNOSIS — H1033 Unspecified acute conjunctivitis, bilateral: Secondary | ICD-10-CM

## 2021-05-05 MED ORDER — CIPROFLOXACIN HCL 0.3 % OP SOLN: OPHTHALMIC | 0 refills | Status: DC

## 2021-05-05 NOTE — Patient Instructions (Signed)
Allergic Conjunctivitis, Adult Allergic conjunctivitis is inflammation of the clear membrane (conjunctiva) that covers the white part of your eye and the inner surface of your eyelid. This condition can make your eye red or pink. It can also make your eye feel itchy. This condition cannot be spread from one person to another person (is not contagious). What are the causes? This condition is caused by allergens. These are things that can cause an allergic reaction in some people but not in others. Common allergens include: Outdoor allergens, such as: Pollen, including pollen from grass and weeds. Mold. Car fumes. Indoor allergens, such as: Dust. Smoke. Mold. Proteins in a pet's pee (urine), saliva, or dander. What increases the risk? You are more likely to develop this condition if you have a family history of these things: Allergies. Conditions that you get because of allergens, such as asthma or inflammation of the skin (eczema). What are the signs or symptoms? Symptoms of this condition include eyes that are: Itchy. Red. Watery. Puffy. Your eyes may also: Sting or burn. Have clear fluid draining from them. Have thick mucus coming from them. How is this treated? This condition may be treated with: Cold, wet cloths (cold compresses) to soothe itching and swelling. Washing the face to remove allergens. Eye drops. These may include: Eye drops that block allergies. Eye drops that reduce swelling and irritation. Steroid eye drops if other treatments have not worked. Oral antihistamine medicines. These medicines lessen your allergies. You may need these if eye drops do not help or are difficult to use. Follow these instructions at home: Eye care Place a cool, clean washcloth on your eye for 10-20 minutes. Do this 3-4 times a day. Do not touch or rub your eyes. Do not wear contact lenses until the inflammation is gone. Wear glasses instead. Do not wear eye makeup until the  inflammation is gone. General instructions Try not to be around things that you are allergic to. Take or apply over-the-counter and prescription medicines only as told by your doctor. These include any eye drops. Drink enough fluid to keep your pee pale yellow. Keep all follow-up visits as told by your doctor. This is important. Contact a doctor if: Your symptoms get worse. Your symptoms do not get better with treatment. You have mild eye pain. You are sensitive to light. You have spots or blisters on your eyes. You have pus coming from your eye. You have a fever. Get help right away if: You have redness, swelling, or other symptoms in only one eye. You cannot see well. You have other vision changes. You have very bad eye pain. Summary Allergic conjunctivitis is caused by allergens. It can make your eye red or pink, and it can make your eye feel itchy. This condition cannot be spread from one person to another person (is not contagious). Try not to be around things that you are allergic to. Take or apply over-the-counter and prescription medicines only as told by your doctor. These include any eye drops. Contact your doctor if your symptoms get worse or they do not get better with treatment. This information is not intended to replace advice given to you by your health care provider. Make sure you discuss any questions you have with your health care provider. Document Revised: 04/30/2019 Document Reviewed: 04/30/2019 Elsevier Patient Education  2022 [employer].

## 2021-05-05 NOTE — Progress Notes (Signed)
   Subjective:    Patient ID: Yesenia Moore, female    DOB: May 11, 1976, 45 y.o.   MRN: [id number]  HPI 45 yo female in non acute distress, presents today with eye itching and redness x 5 days. Starting 1 month ago exposed to family members who had conjunctivitis. Discharge from eyes, yes. Took one Claritin yesterday which helped minimally.   Used left over eye drops polymixin sulfate trimeth oprim and this caused a reaction of redness and burning pain, she immediately washed out the drops with water.  Blood pressure 132/82, pulse 94, temperature 98.6 F (37 C), temperature source Tympanic, resp. rate 16, last menstrual period 05/05/2021, SpO2 96 %.      Allergies  Allergen Reactions   Amoxicillin     At end of visit with patient when asked to continue Amoxicillin she reports she may have had a reaction to " this years ago" She does not know what type of reaction. Denies shortness of breath, hives or throat swelling .    Lidocaine Other (See Comments)    "fast heart beat, dizziness"    She says she now can take amoxicillin.   Review of Systems  Constitutional:  Negative for chills and fever.  HENT:  Negative for congestion and sore throat.   Eyes:  Positive for discharge, redness and itching. Negative for photophobia, pain and visual disturbance.  Respiratory:  Negative for cough.   Neurological:  Negative for headaches.     Both husband and child with conjunctivitis one month ago. Objective:   Physical Exam Constitutional:      Appearance: Normal appearance.  Eyes:     General: Lids are normal. Vision grossly intact. Gaze aligned appropriately. No allergic shiner or visual field deficit.       Right eye: No foreign body, discharge or hordeolum.        Left eye: No foreign body, discharge or hordeolum.     Extraocular Movements: Extraocular movements intact.     Conjunctiva/sclera:     Right eye: Right conjunctiva is injected.     Left eye: Left conjunctiva is injected.      Pupils: Pupils are equal, round, and reactive to light.      Comments: Vision changes Discharge R worse then L   Musculoskeletal:     Cervical back: Normal range of motion.  Skin:    General: Skin is warm and dry.  Neurological:     General: No focal deficit present.     Mental Status: She is alert and oriented to person, place, and time.  Psychiatric:        Mood and Affect: Mood normal.        Behavior: Behavior normal.        Thought Content: Thought content normal.        Judgment: Judgment normal.    Polymixin  trimethoprim opth drops, caused burning, pain and swelling. Clear eyes similar symptoms she then bought another Clear Eyes and she could use this without a problem. Eyes are improved per patient.      Assessment & Plan:  Conjunctivitis bilateral Meds ordered this encounter  Medications   ciprofloxacin (CILOXAN) 0.3 % ophthalmic solution    Sig: A 1 drop, every 4 hours, while awake, for the next 5-7 days.    Dispense:  5 mL    Refill:  0   Return in 3-5 days if not improving., patient verbalizes understanding and has no questions at discharge.

## 2021-09-15 ENCOUNTER — Encounter: Payer: Self-pay | Admitting: Nurse Practitioner

## 2021-09-15 ENCOUNTER — Other Ambulatory Visit: Payer: Self-pay

## 2021-09-15 ENCOUNTER — Ambulatory Visit: Payer: BC Managed Care – PPO | Admitting: Nurse Practitioner

## 2021-09-15 VITALS — BP 116/74 | HR 102 | Temp 98.1°F | Resp 18

## 2021-09-15 DIAGNOSIS — J069 Acute upper respiratory infection, unspecified: Secondary | ICD-10-CM

## 2021-09-15 LAB — POCT INFLUENZA A/B
Influenza A, POC: NEGATIVE
Influenza B, POC: NEGATIVE

## 2021-09-15 LAB — POC COVID19 BINAXNOW: SARS Coronavirus 2 Ag: NEGATIVE

## 2021-09-15 NOTE — Progress Notes (Signed)
? ?  Subjective:  ? ? Patient ID: Yesenia Moore, female    DOB: [birth year], 46 y.o.   MRN: [id number] ? ?HPI ? ?46 year old female presenting to [facility] with complaints of nasal congestion and a cough for the past 3-4 days. She also has fatigue.  ? ?She took a COVID test at home over the weekend  ?Denies sick contacts  ? ?Today's Vitals  ? 09/15/21 1456  ?BP: 116/74  ?Pulse: (!) 102  ?Resp: 18  ?Temp: 98.1 ?F (36.7 ?C)  ?TempSrc: Tympanic  ?SpO2: 95%  ? ?There is no height or weight on file to calculate BMI.  ? ?Review of Systems  ?Constitutional:  Positive for fatigue.  ?HENT:  Positive for congestion, postnasal drip and sinus pressure.   ?Respiratory:  Positive for cough.   ?Cardiovascular: Negative.   ?Genitourinary: Negative.   ?Musculoskeletal: Negative.   ?Neurological: Negative.   ? ?   ?Objective:  ? Physical Exam ?HENT:  ?   Right Ear: Tympanic membrane, ear canal and external ear normal.  ?   Left Ear: Tympanic membrane, ear canal and external ear normal.  ?   Nose: Congestion present.  ?   Mouth/Throat:  ?   Mouth: Mucous membranes are moist.  ?Eyes:  ?   Pupils: Pupils are equal, round, and reactive to light.  ?Cardiovascular:  ?   Rate and Rhythm: Normal rate and regular rhythm.  ?   Heart sounds: Normal heart sounds.  ?Pulmonary:  ?   Effort: Pulmonary effort is normal.  ?Musculoskeletal:     ?   General: Normal range of motion.  ?   Cervical back: Normal range of motion.  ?Skin: ?   General: Skin is warm.  ?   Capillary Refill: Capillary refill takes less than 2 seconds.  ?Neurological:  ?   General: No focal deficit present.  ?   Mental Status: She is alert.  ?Psychiatric:     ?   Mood and Affect: Mood normal.  ? ? ?Recent Results (from the past 2160 hour(s))  ?POC COVID-19     Status: Normal  ? Collection Time: 09/15/21  3:08 PM  ?Result Value Ref Range  ? SARS Coronavirus 2 Ag Negative Negative  ?POCT Influenza A/B     Status: Normal  ? Collection Time: 09/15/21  3:08 PM  ?Result  Value Ref Range  ? Influenza A, POC Negative Negative  ? Influenza B, POC Negative Negative  ?  ? ? ?   ?Assessment & Plan:  ?1. Viral upper respiratory tract infection ?Encouraged OTC medications for symptom relief.  ?Provided note for school  ?RTC if symptoms persist for one week or with new or worsening symptoms as dicussed  ? ?Continue Vitamin C  ? ? ?

## 2021-12-11 ENCOUNTER — Other Ambulatory Visit: Payer: Self-pay | Admitting: Internal Medicine

## 2021-12-11 DIAGNOSIS — Z1231 Encounter for screening mammogram for malignant neoplasm of breast: Secondary | ICD-10-CM

## 2022-01-13 ENCOUNTER — Ambulatory Visit: Payer: BC Managed Care – PPO | Admitting: Dermatology

## 2022-01-13 DIAGNOSIS — B070 Plantar wart: Secondary | ICD-10-CM

## 2022-01-13 DIAGNOSIS — L719 Rosacea, unspecified: Secondary | ICD-10-CM | POA: Diagnosis not present

## 2022-01-13 DIAGNOSIS — B009 Herpesviral infection, unspecified: Secondary | ICD-10-CM

## 2022-01-13 DIAGNOSIS — L821 Other seborrheic keratosis: Secondary | ICD-10-CM

## 2022-01-13 MED ORDER — AMBULATORY NON FORMULARY MEDICATION: 1.0000 | 11 refills | Status: DC

## 2022-01-13 MED ORDER — VALACYCLOVIR HCL 500 MG PO TABS: 500.0000 mg | ORAL_TABLET | Freq: Two times a day (BID) | ORAL | 11 refills | Status: DC

## 2022-01-13 NOTE — Progress Notes (Signed)
New Patient Visit  Subjective  Yesenia Moore is a 46 y.o. female who presents for the following: Rosacea (Face, uses SM Triple cream), check spot (R foot, 11m painful sometomes), moles to check (Breast, had one txted with LN2 ~243yrago by another provider), and fever blisters (Perioral, started today, painful, no treatmetn).  The following portions of the chart were reviewed this encounter and updated as appropriate:   Tobacco  Allergies  Meds  Problems  Med Hx  Surg Hx  Fam Hx     Review of Systems:  No other skin or systemic complaints except as noted in HPI or Assessment and Plan.  Objective  Well appearing patient in no apparent distress; mood and affect are within normal limits.  A focused examination was performed including face, breast, right foot. Relevant physical exam findings are noted in the Assessment and Plan.  face Mild erythema face  R ant lat sole Verrucous papule 0.5cm -- Discussed viral etiology and contagion.   L and R breast Stuck-on, waxy, tan-brown papules and plaques -- Discussed benign etiology and prognosis.    Assessment & Plan  HSV (herpes simplex virus) infection L lower lip Herpes Simplex Virus = Cold Sores = Fever Blisters is a chronic recurring blistering; scabbing sore-producing viral infection that is recurrent usually in the same area triggered by stress, sun/UV exposure and trauma.  It is infectious and can be spread from person to person by direct contact.  It is not curable, but is treatable with topical and oral medication.  Start Valtrex '500mg'$  1 po bid for 5-7 days for each episode of fever blisters  valACYclovir (VALTREX) 500 MG tablet - L lower lip Take 1 tablet (500 mg total) by mouth 2 (two) times daily. 1 po bid for 5 to 7 days with each episode of fever blisters  Rosacea face Rosacea is a chronic progressive skin condition usually affecting the face of adults, causing redness and/or acne bumps. It is treatable but not  curable. It sometimes affects the eyes (ocular rosacea) as well. It may respond to topical and/or systemic medication and can flare with stress, sun exposure, alcohol, exercise and some foods.  Daily application of broad spectrum spf 30+ sunscreen to face is recommended to reduce flares.  Cont SM metronidazole/ivermectin/azelaic acid cr qd/bid to face 30g 10 rf  Apply 1 Application topically at bedtime. Qhs to face for Rosacea  Plantar wart R ant lat sole Discussed viral etiology and risk of spread.  Discussed multiple treatments may be required to clear warts.  Discussed possible post-treatment dyspigmentation and risk of recurrence.  If not improving with LN2, recommend pt starting Urea 40% cream   Destruction of lesion - R ant lat sole Complexity: simple   Destruction method: cryotherapy   Informed consent: discussed and consent obtained   Timeout:  patient name, date of birth, surgical site, and procedure verified Lesion destroyed using liquid nitrogen: Yes   Region frozen until ice ball extended beyond lesion: Yes   Outcome: patient tolerated procedure well with no complications   Post-procedure details: wound care instructions given    Seborrheic keratosis L and R breast Benign-appearing.  Observation.  Call clinic for new or changing lesions.  Recommend daily use of broad spectrum spf 30+ sunscreen to sun-exposed areas.   Return for 6-1279mr Rosacea f/u, Wart f/u.  I, SonOthelia PullingMA, am acting as scribe for DavSarina SerD . Documentation: I have reviewed the above documentation for accuracy and completeness, and  I agree with the above.  Sarina Ser, MD

## 2022-01-13 NOTE — Patient Instructions (Addendum)
Instructions for [facility] Medications  One or more of your medications was sent to the [facility] mail order compounding pharmacy. You will receive an email from them and can purchase the medicine through that link. It will then be mailed to your home at the address you confirmed. If for any reason you do not receive an email from them, please check your spam folder. If you still do not find the email, please let us know. [facility] phone number is [phone].   For Wart Cryotherapy Aftercare  Wash gently with soap and water everyday.   Apply Vaseline and Band-Aid daily until healed.  If not improving in 4 weeks may start Urea cream 40% daily, can buy this on [place].  Spots on breast  Seborrheic Keratosis  What causes seborrheic keratoses? Seborrheic keratoses are harmless, common skin growths that first appear during adult life.  As time goes by, more growths appear.  Some people may develop a large number of them.  Seborrheic keratoses appear on both covered and uncovered body parts.  They are not caused by sunlight.  The tendency to develop seborrheic keratoses can be inherited.  They vary in color from skin-colored to gray, brown, or even black.  They can be either smooth or have a rough, warty surface.   Seborrheic keratoses are superficial and look as if they were stuck on the skin.  Under the microscope this type of keratosis looks like layers upon layers of skin.  That is why at times the top layer may seem to fall off, but the rest of the growth remains and re-grows.    Treatment Seborrheic keratoses do not need to be treated, but can easily be removed in the office.  Seborrheic keratoses often cause symptoms when they rub on clothing or jewelry.  Lesions can be in the way of shaving.  If they become inflamed, they can cause itching, soreness, or burning.  Removal of a seborrheic keratosis can be accomplished by freezing, burning, or surgery. If any spot bleeds,  scabs, or grows rapidly, please return to have it checked, as these can be an indication of a skin cancer.   Due to recent changes in healthcare laws, you may see results of your pathology and/or laboratory studies on MyChart before the doctors have had a chance to review them. We understand that in some cases there may be results that are confusing or concerning to you. Please understand that not all results are received at the same time and often the doctors may need to interpret multiple results in order to provide you with the best plan of care or course of treatment. Therefore, we ask that you please give [place] 2 business days to thoroughly review all your results before contacting the office for clarification. Should we see a critical lab result, you will be contacted sooner.   If You Need Anything After Your Visit  If you have any questions or concerns for your doctor, please call our main line at [phone] and press option 4 to reach your doctor's medical assistant. If no one answers, please leave a voicemail as directed and we will return your call as soon as possible. Messages left after 4 pm will be answered the following business day.   You may also send [place] a message via [place]. We typically respond to MyChart messages within 1-2 business days.  For prescription refills, please ask your pharmacy to contact our office. Our fax number is [phone].  If you have  an urgent issue when the clinic is closed that cannot wait until the next business day, you can page your doctor at the number below.    Please note that while we do our best to be available for urgent issues outside of office hours, we are not available 24/7.   If you have an urgent issue and are unable to reach [place], you may choose to seek medical care at your doctor's office, retail clinic, urgent care center, or emergency room.  If you have a medical emergency, please immediately call 911 or go to the emergency  department.  Pager Numbers  - Dr. Nehemiah Massed: [phone]  - Dr. Laurence Ferrari: [phone]  - Dr. Nicole Kindred: [phone]  In the event of inclement weather, please call our main line at [phone] for an update on the status of any delays or closures.  Dermatology Medication Tips: Please keep the boxes that topical medications come in in order to help keep track of the instructions about where and how to use these. Pharmacies typically print the medication instructions only on the boxes and not directly on the medication tubes.   If your medication is too expensive, please contact our office at [phone] option 4 or send [place] a message through [place].   We are unable to tell what your co-pay for medications will be in advance as this is different depending on your insurance coverage. However, we may be able to find a substitute medication at lower cost or fill out paperwork to get insurance to cover a needed medication.   If a prior authorization is required to get your medication covered by your insurance company, please allow [place] 1-2 business days to complete this process.  Drug prices often vary depending on where the prescription is filled and some pharmacies may offer cheaper prices.  The website www.goodrx.com contains coupons for medications through different pharmacies. The prices here do not account for what the cost may be with help from insurance (it may be cheaper with your insurance), but the website can give you the price if you did not use any insurance.  - You can print the associated coupon and take it with your prescription to the pharmacy.  - You may also stop by our office during regular business hours and pick up a GoodRx coupon card.  - If you need your prescription sent electronically to a different pharmacy, notify our office through [facility] or by phone at [phone] option 4.     Si Usted Necesita Algo Despus de Su Visita  Tambin puede enviarnos un  mensaje a travs de Pharmacist, community. Por lo general respondemos a los mensajes de MyChart en el transcurso de 1 a 2 das hbiles.  Para renovar recetas, por favor pida a su farmacia que se ponga en contacto con nuestra oficina. Harland Dingwall de fax es [place] [phone].  Si tiene un asunto urgente cuando la clnica est cerrada y que no puede esperar hasta el siguiente da hbil, puede llamar/localizar a su doctor(a) al nmero que aparece a continuacin.   Por favor, tenga en cuenta que aunque hacemos todo lo posible para estar disponibles para asuntos urgentes fuera del horario de [place], no estamos disponibles las 24 horas del da, los 7 das de la [place].   Si tiene un problema urgente y no puede comunicarse con nosotros, puede optar por buscar atencin mdica  en el consultorio de su doctor(a), en una clnica privada, en un centro de atencin urgente o en una sala de  emergencias.  Si tiene Engineering geologist, por favor llame inmediatamente al 911 o vaya a la sala de emergencias.  Nmeros de bper  - Dr. Nehemiah Massed: [phone]  - Dra. Moye: [phone]  - Dra. Nicole Kindred: [phone]  En caso de inclemencias del [place], por favor llame a Johnsie Kindred principal al [phone] para una actualizacin sobre el [place] de cualquier retraso o cierre.  Consejos para la medicacin en dermatologa: Por favor, guarde las cajas en las que vienen los medicamentos de uso tpico para ayudarle a seguir las instrucciones sobre dnde y cmo usarlos. Las farmacias generalmente imprimen las instrucciones del medicamento slo en las cajas y no directamente en los tubos del [place].   Si su medicamento es muy caro, por favor, pngase en contacto con Zigmund Daniel llamando al [phone] y presione la opcin 4 o envenos un mensaje a travs de Pharmacist, community.   No podemos decirle cul ser su copago por los medicamentos por adelantado ya que esto es diferente dependiendo de la cobertura de su seguro. Sin embargo,  es posible que podamos encontrar un medicamento sustituto a Electrical engineer un formulario para que el seguro cubra el medicamento que se considera necesario.   Si se requiere una autorizacin previa para que su compaa de seguros [place] su medicamento, por favor permtanos de 1 a 2 das hbiles para completar este proceso.  Los precios de los medicamentos varan con frecuencia dependiendo del Environmental consultant de dnde se surte la receta y alguna farmacias pueden ofrecer precios ms baratos.  El sitio web www.goodrx.com tiene cupones para medicamentos de Airline pilot. Los precios aqu no tienen en cuenta lo que podra costar con la ayuda del seguro (puede ser ms barato con su seguro), pero el sitio web puede darle el precio si no utiliz Research scientist (physical sciences).  - Puede imprimir el cupn correspondiente y llevarlo con su receta a la farmacia.  - Tambin puede pasar por nuestra oficina durante el horario de atencin regular y Charity fundraiser una tarjeta de cupones de GoodRx.  - Si necesita que su receta se enve electrnicamente a una farmacia diferente, informe a nuestra oficina a travs de MyChart de [place] o por telfono llamando al [phone] y presione la opcin 4.

## 2022-01-14 MED ORDER — IVERMECTIN 1 % EX CREA: 1.0000 | TOPICAL_CREAM | Freq: Every day | CUTANEOUS | 11 refills | Status: DC

## 2022-01-17 ENCOUNTER — Encounter: Payer: Self-pay | Admitting: Dermatology

## 2022-02-25 ENCOUNTER — Ambulatory Visit
Admission: RE | Admit: 2022-02-25 | Discharge: 2022-02-25 | Disposition: A | Discharge status: Home / Self Care | Payer: BC Managed Care – PPO | Source: Ambulatory Visit | Attending: Internal Medicine | Admitting: Internal Medicine

## 2022-02-25 DIAGNOSIS — Z1231 Encounter for screening mammogram for malignant neoplasm of breast: Secondary | ICD-10-CM | POA: Insufficient documentation

## 2022-03-02 ENCOUNTER — Other Ambulatory Visit: Payer: Self-pay | Admitting: *Deleted

## 2022-03-02 ENCOUNTER — Inpatient Hospital Stay
Admission: RE | Admit: 2022-03-02 | Discharge: 2022-03-02 | Disposition: A | Discharge status: Home / Self Care | Payer: Self-pay | Source: Ambulatory Visit | Attending: *Deleted | Admitting: *Deleted

## 2022-03-02 DIAGNOSIS — Z1231 Encounter for screening mammogram for malignant neoplasm of breast: Secondary | ICD-10-CM

## 2022-03-04 ENCOUNTER — Other Ambulatory Visit: Payer: Self-pay | Admitting: Internal Medicine

## 2022-03-04 DIAGNOSIS — R928 Other abnormal and inconclusive findings on diagnostic imaging of breast: Secondary | ICD-10-CM

## 2022-03-04 DIAGNOSIS — N6489 Other specified disorders of breast: Secondary | ICD-10-CM

## 2022-03-05 ENCOUNTER — Ambulatory Visit
Admission: RE | Admit: 2022-03-05 | Discharge: 2022-03-05 | Disposition: A | Discharge status: Home / Self Care | Payer: BC Managed Care – PPO | Source: Ambulatory Visit | Attending: Internal Medicine | Admitting: Internal Medicine

## 2022-03-05 DIAGNOSIS — R928 Other abnormal and inconclusive findings on diagnostic imaging of breast: Secondary | ICD-10-CM

## 2022-03-05 DIAGNOSIS — N6489 Other specified disorders of breast: Secondary | ICD-10-CM | POA: Diagnosis present

## 2022-03-11 ENCOUNTER — Other Ambulatory Visit: Payer: BC Managed Care – PPO

## 2022-06-29 ENCOUNTER — Other Ambulatory Visit: Payer: Self-pay | Admitting: Gastroenterology

## 2022-06-29 DIAGNOSIS — R112 Nausea with vomiting, unspecified: Secondary | ICD-10-CM

## 2022-06-29 DIAGNOSIS — R1031 Right lower quadrant pain: Secondary | ICD-10-CM

## 2022-07-03 ENCOUNTER — Emergency Department
Admission: EM | Admit: 2022-07-03 | Discharge: 2022-07-03 | Disposition: A | Discharge status: Home / Self Care | Payer: BC Managed Care – PPO | Attending: Emergency Medicine | Admitting: Emergency Medicine

## 2022-07-03 ENCOUNTER — Other Ambulatory Visit: Payer: Self-pay

## 2022-07-03 ENCOUNTER — Emergency Department: Payer: BC Managed Care – PPO

## 2022-07-03 DIAGNOSIS — R109 Unspecified abdominal pain: Secondary | ICD-10-CM | POA: Diagnosis present

## 2022-07-03 DIAGNOSIS — R1031 Right lower quadrant pain: Secondary | ICD-10-CM | POA: Diagnosis not present

## 2022-07-03 LAB — URINALYSIS, ROUTINE W REFLEX MICROSCOPIC
Bilirubin Urine: NEGATIVE
Glucose, UA: NEGATIVE mg/dL
Ketones, ur: NEGATIVE mg/dL
Nitrite: NEGATIVE
Protein, ur: NEGATIVE mg/dL
Specific Gravity, Urine: 1.004 — ABNORMAL LOW (ref 1.005–1.030)
pH: 6 (ref 5.0–8.0)

## 2022-07-03 LAB — COMPREHENSIVE METABOLIC PANEL
ALT: 15 U/L (ref 0–44)
AST: 19 U/L (ref 15–41)
Albumin: 4.2 g/dL (ref 3.5–5.0)
Alkaline Phosphatase: 44 U/L (ref 38–126)
Anion gap: 9 (ref 5–15)
BUN: 11 mg/dL (ref 6–20)
CO2: 23 mmol/L (ref 22–32)
Calcium: 9.4 mg/dL (ref 8.9–10.3)
Chloride: 105 mmol/L (ref 98–111)
Creatinine, Ser: 0.7 mg/dL (ref 0.44–1.00)
GFR, Estimated: >60 mL/min (ref >60)
Glucose, Bld: 110 mg/dL — ABNORMAL HIGH (ref 70–99)
Potassium: 3.4 mmol/L — ABNORMAL LOW (ref 3.5–5.1)
Sodium: 137 mmol/L (ref 135–145)
Total Bilirubin: 0.6 mg/dL (ref 0.3–1.2)
Total Protein: 7.1 g/dL (ref 6.5–8.1)

## 2022-07-03 LAB — CBC
HCT: 41.1 % (ref 36.0–46.0)
Hemoglobin: 13.3 g/dL (ref 12.0–15.0)
MCH: 28.4 pg (ref 26.0–34.0)
MCHC: 32.4 g/dL (ref 30.0–36.0)
MCV: 87.6 fL (ref 80.0–100.0)
Platelets: 213 10*3/uL (ref 150–400)
RBC: 4.69 MIL/uL (ref 3.87–5.11)
RDW: 12.7 % (ref 11.5–15.5)
WBC: 6.6 10*3/uL (ref 4.0–10.5)
nRBC: 0 % (ref 0.0–0.2)

## 2022-07-03 LAB — POC URINE PREG, ED: Preg Test, Ur: NEGATIVE

## 2022-07-03 LAB — LIPASE, BLOOD: Lipase: 41 U/L (ref 11–51)

## 2022-07-03 MED ORDER — KETOROLAC TROMETHAMINE 15 MG/ML IJ SOLN
15.0000 mg | Freq: Once | INTRAMUSCULAR | Status: AC
  Administered 2022-07-03: 15 mg via INTRAMUSCULAR

## 2022-07-03 MED ORDER — POTASSIUM CHLORIDE CRYS ER 20 MEQ PO TBCR
20.0000 meq | EXTENDED_RELEASE_TABLET | Freq: Once | ORAL | Status: AC
  Administered 2022-07-03: 20 meq via ORAL
  Filled 2022-07-03: qty 1

## 2022-07-03 MED ORDER — OXYCODONE-ACETAMINOPHEN 5-325 MG PO TABS
1.0000 | ORAL_TABLET | Freq: Once | ORAL | Status: AC
  Administered 2022-07-03: 1 via ORAL
  Filled 2022-07-03: qty 1

## 2022-07-03 MED ORDER — ONDANSETRON 4 MG PO TBDP: 4.0000 mg | ORAL_TABLET | Freq: Three times a day (TID) | ORAL | 0 refills | Status: AC | PRN

## 2022-07-03 MED ORDER — OXYCODONE-ACETAMINOPHEN 5-325 MG PO TABS: 1.0000 | ORAL_TABLET | Freq: Four times a day (QID) | ORAL | 0 refills | Status: AC | PRN

## 2022-07-03 MED ORDER — ACETAMINOPHEN 500 MG PO TABS
1000.0000 mg | ORAL_TABLET | Freq: Once | ORAL | Status: AC
  Administered 2022-07-03: 1000 mg via ORAL
  Filled 2022-07-03: qty 2

## 2022-07-03 MED ORDER — KETOROLAC TROMETHAMINE 15 MG/ML IJ SOLN
15.0000 mg | Freq: Once | INTRAMUSCULAR | Status: DC
  Filled 2022-07-03: qty 1

## 2022-07-03 MED ORDER — OXYCODONE-ACETAMINOPHEN 5-325 MG PO TABS
1.0000 | ORAL_TABLET | Freq: Once | ORAL | Status: DC
  Filled 2022-07-03: qty 1

## 2022-07-03 MED ORDER — ONDANSETRON 4 MG PO TBDP
4.0000 mg | ORAL_TABLET | Freq: Once | ORAL | Status: AC
  Administered 2022-07-03: 4 mg via ORAL
  Filled 2022-07-03: qty 1

## 2022-07-03 NOTE — ED Provider Notes (Signed)
  Physical Exam  BP (!) 140/95   Pulse 81   Temp 98.7 F (37.1 C) (Oral)   Resp 16   Ht '5\' 3"'$  (1.6 m)   Wt 66.7 kg   SpO2 100%   BMI 26.04 kg/m   Physical Exam  Procedures  Procedures  ED Course / MDM    Medical Decision Making Amount and/or Complexity of Data Reviewed Labs: ordered. Radiology: ordered.  Risk OTC drugs. Prescription drug management.   Assumed patient care from Blake Divine, MD.  Dedicated pelvic ultrasound indicates a 2.6 cm complex appearing cyst of the right ovary, possibly hemorrhagic.  On exam, patient's pain was mostly lower pelvic pain and did not have any pain localized to the right lower quadrant.  Low suspicion for appendicitis at this time without elevated white blood cell count, vomiting or fever at home.  Patient was given a Percocet in the emergency department and injection of Toradol for pain and advised to keep her appointment with OB/GYN this week.  Return precautions were given to return with new or worsening symptoms.  All patient questions were answered.       Yesenia Moore, Hershal Coria 07/03/22 2031    Harvest Dark, MD 07/03/22 2330

## 2022-07-03 NOTE — ED Notes (Signed)
Report given to Travis RN

## 2022-07-03 NOTE — ED Notes (Signed)
Patient taken to ultrasound.

## 2022-07-03 NOTE — ED Provider Notes (Signed)
[facility] Provider Note    Event Date/Time   First MD Initiated Contact with Patient 07/03/22 1608     (approximate)   History   Chief Complaint Abdominal Pain   HPI  Yesenia Moore is a 47 y.o. female with past medical history of Hashimoto thyroiditis and anemia who presents to the ED complaining of abdominal pain.  Patient reports that she has had about 3 weeks of constant pain in the right lower quadrant of her abdomen.  She states that the pain initially seem to come on with her menstrual period, but did not improve when the bleeding stopped.  Pain has increased in severity over the past couple of days and has now spread across her lower abdomen.  She has noticed occasional vaginal spotting but currently denies any bleeding or discharge.  She has not had any fever, dysuria, or flank pain.  She denies any prior abdominal surgeries beyond emergency C-section with vertical incision, she has been seen by GI for the symptoms and has a colonoscopy scheduled in 2 weeks, additionally has an appointment with OB/GYN later this week.     Physical Exam   Triage Vital Signs: ED Triage Vitals  Enc Vitals Group     BP 07/03/22 1427 (!) 146/93     Pulse Rate 07/03/22 1427 (!) 109     Resp 07/03/22 1427 18     Temp 07/03/22 1427 98.7 F (37.1 C)     Temp Source 07/03/22 1427 Oral     SpO2 07/03/22 1427 95 %     Weight 07/03/22 1424 147 lb (66.7 kg)     Height 07/03/22 1424 '5\' 3"'$  (1.6 m)     Head Circumference --      Peak Flow --      Pain Score 07/03/22 1424 8     Pain Loc --      Pain Edu? --      Excl. in [place]? --     Most recent vital signs: Vitals:   07/03/22 1427 07/03/22 1637  BP: (!) 146/93 (!) 153/101  Pulse: (!) 109 88  Resp: 18 16  Temp: 98.7 F (37.1 C)   SpO2: 95% 100%    Constitutional: Alert and oriented. Eyes: Conjunctivae are normal. Head: Atraumatic. Nose: No congestion/rhinnorhea. Mouth/Throat: Mucous membranes are moist.   Cardiovascular: Normal rate, regular rhythm. Grossly normal heart sounds.  2+ radial pulses bilaterally. Respiratory: Normal respiratory effort.  No retractions. Lungs CTAB. Gastrointestinal: Soft and tender to palpation in the right lower quadrant with no rebound or guarding. No distention. Musculoskeletal: No lower extremity tenderness nor edema.  Neurologic:  Normal speech and language. No gross focal neurologic deficits are appreciated.    ED Results / Procedures / Treatments   Labs (all labs ordered are listed, but only abnormal results are displayed) Labs Reviewed  COMPREHENSIVE METABOLIC PANEL - Abnormal; Notable for the following components:      Result Value   Potassium 3.4 (*)    Glucose, Bld 110 (*)    All other components within normal limits  URINALYSIS, ROUTINE W REFLEX MICROSCOPIC - Abnormal; Notable for the following components:   Color, Urine STRAW (*)    APPearance CLEAR (*)    Specific Gravity, Urine 1.004 (*)    Hgb urine dipstick LARGE (*)    Leukocytes,Ua TRACE (*)    Bacteria, UA RARE (*)    All other components within normal limits  LIPASE, BLOOD  CBC  POC URINE PREG,  ED    PROCEDURES:  Critical Care performed: No  Procedures   MEDICATIONS ORDERED IN ED: Medications  acetaminophen (TYLENOL) tablet 1,000 mg (1,000 mg Oral Given 07/03/22 1747)  potassium chloride SA (KLOR-CON M) CR tablet 20 mEq (20 mEq Oral Given 07/03/22 1804)     IMPRESSION / MDM / ASSESSMENT AND PLAN / ED COURSE  I reviewed the triage vital signs and the nursing notes.                              47 y.o. female with past medical history of Hashimoto thyroiditis and anemia who presents to the ED complaining of about 3 weeks of pain in the right lower quadrant of her abdomen with intermittent vaginal bleeding, now spreading across to the left side of her lower abdomen.  Patient's presentation is most consistent with acute presentation with potential threat to life or bodily  function.  Differential diagnosis includes, but is not limited to, ovarian torsion, ovarian cyst, uterine fibroids, UTI, appendicitis, kidney stone, electrolyte abnormality, AKI.  Patient well-appearing and in no acute distress, vital signs are unremarkable.  Pain is reproducible with palpation of the right lower quadrant of her abdomen but low suspicion for appendicitis at this time given timeframe of symptoms.  She does report a history of uterine fibroids, denies history of ovarian cyst.  We will further assess with pelvic ultrasound, urinalysis shows no signs of infection and pregnancy testing is negative.  Labs are reassuring with no significant anemia, leukocytosis, electrode abnormality, or AKI.  LFTs and lipase are unremarkable.  Patient declines pain medicine beyond Tylenol, plan to reassess following imaging.  Patient turned over to oncoming divider pending pelvic ultrasound results and reassessment.      FINAL CLINICAL IMPRESSION(S) / ED DIAGNOSES   Final diagnoses:  RLQ abdominal pain     Rx / DC Orders   ED Discharge Orders     None        Note:  This document was prepared using Dragon voice recognition software and may include unintentional dictation errors.   Blake Divine, MD 07/03/22 [phone]

## 2022-07-03 NOTE — Discharge Instructions (Addendum)
You can take Percocet for pain. Please pair Percocet was Zofran in order to avoid nausea. Progressed can be constipating, so if you notice constipation, please start MiraLAX. You can take 17 g, which is 1 capful once daily. Please start probiotics such as physicians choice once daily. Please keep your follow-up appointment with OB/GYN this week.

## 2022-07-03 NOTE — ED Triage Notes (Signed)
Pt states she is having RLQ pain for a while but then it got worse- pt has an appointment with her OB and is trying to get in with a GI- pt states when she urinates it is pinkish and she is worried there is blood in it- pt denies n/v/d but states pain is worse with deep breaths

## 2022-07-06 ENCOUNTER — Other Ambulatory Visit: Payer: Self-pay | Admitting: Gastroenterology

## 2022-07-06 ENCOUNTER — Ambulatory Visit: Payer: BC Managed Care – PPO

## 2022-07-06 DIAGNOSIS — R1031 Right lower quadrant pain: Secondary | ICD-10-CM

## 2022-07-15 ENCOUNTER — Ambulatory Visit: Admission: RE | Admit: 2022-07-15 | Payer: BC Managed Care – PPO | Source: Ambulatory Visit

## 2022-07-19 ENCOUNTER — Telehealth: Payer: Self-pay

## 2022-07-19 ENCOUNTER — Ambulatory Visit: Payer: BC Managed Care – PPO | Admitting: Dermatology

## 2022-07-19 NOTE — Telephone Encounter (Signed)
Patient here today for appointment. Dr. Nehemiah Massed was running about 45 minutes behind and patient did not want to stay and be seen.  Patient advised she needs appointment before July/1 year for any additional refills.

## 2022-07-22 ENCOUNTER — Encounter (INDEPENDENT_AMBULATORY_CARE_PROVIDER_SITE_OTHER): Payer: BC Managed Care – PPO

## 2022-07-22 DIAGNOSIS — K640 First degree hemorrhoids: Secondary | ICD-10-CM | POA: Diagnosis not present

## 2022-07-22 DIAGNOSIS — Z1211 Encounter for screening for malignant neoplasm of colon: Secondary | ICD-10-CM | POA: Diagnosis not present

## 2022-07-22 DIAGNOSIS — R195 Other fecal abnormalities: Secondary | ICD-10-CM | POA: Diagnosis not present

## 2022-10-27 IMAGING — MR MR HEAD WO/W CM
15 series · 48 of 48 positions shown · IV contrast (6ml Gadavist)
Comparison: CT head without contrast 09/21/2020. MR head without
contrast 02/28/2019.

CLINICAL DATA: Neuro deficit, acute stroke suspected. Personal
history of Hashimoto's thyroiditis. Episodes of intermittent vertigo
and blurred vision.

EXAM:
MRI HEAD WITHOUT AND WITH CONTRAST
TECHNIQUE: Multiplanar, multiecho pulse sequences of the brain and surrounding
structures were obtained without and with intravenous contrast.
CONTRAST:  6mL GADAVIST GADOBUTROL 1 MMOL/ML IV SOLN

[Series 5: ax dwi_tracew · axial · 3.0mm · 0.65mm/px · z∈[-111,+43]mm · 6 of 96 slices shown]
[im 1/96]
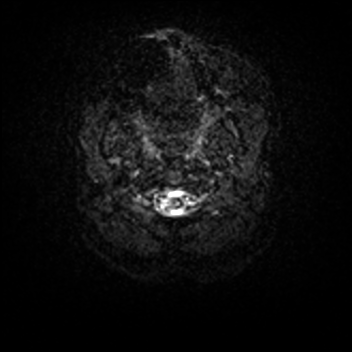
[im 20/96]
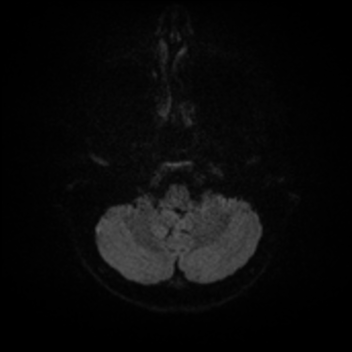
[im 39/96]
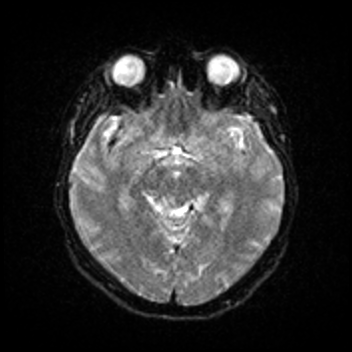
[im 58/96]
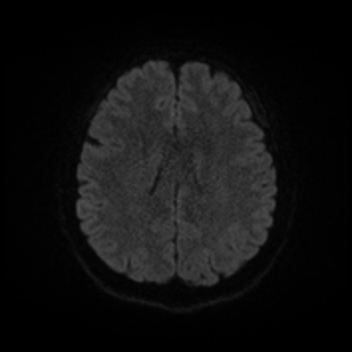
[im 77/96]
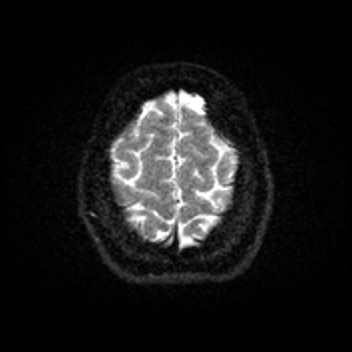
[im 96/96]
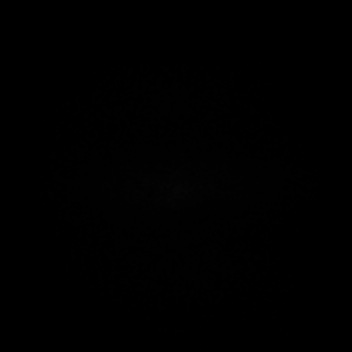

[Series 6: ax dwi_adc · axial · 3.0mm · 0.65mm/px · z∈[-111,+40]mm · 2 of 47 slices shown]
[im 1/47]
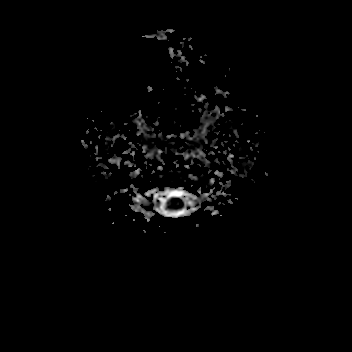
[im 47/47]
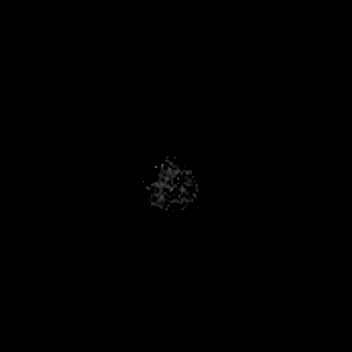

[Series 7: cor dwi_tracew · coronal · 5.0mm · 0.65mm/px · 4 of 80 slices shown]
[im 1/80]
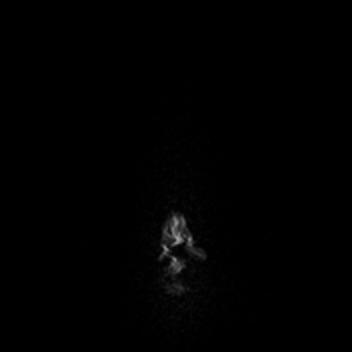
[im 27/80]
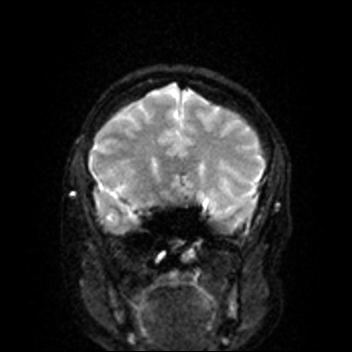
[im 53/80]
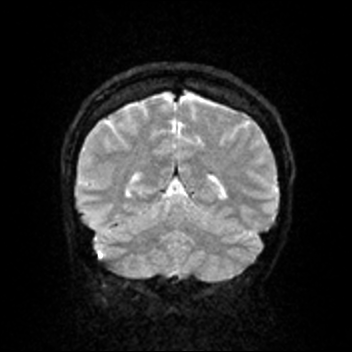
[im 80/80]
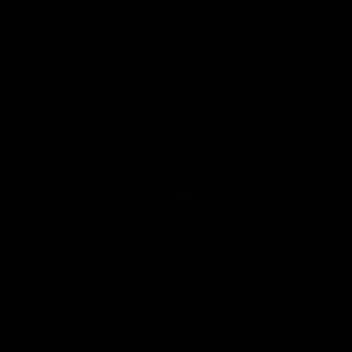

[Series 8: cor dwi_adc · coronal · 5.0mm · 0.65mm/px · 2 of 39 slices shown]
[im 1/39]
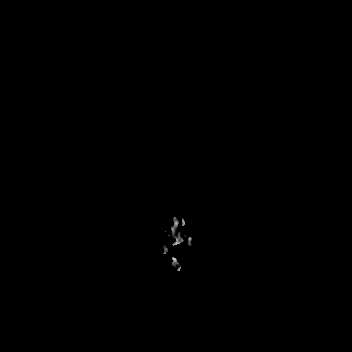
[im 39/39]
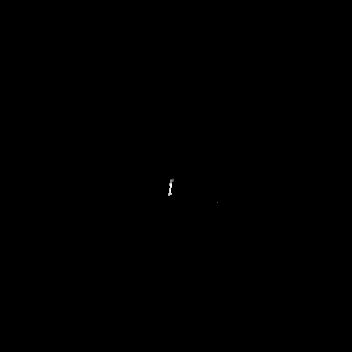

[Series 9: T1 · sagittal · 5.0mm · 0.62mm/px · 1 of 25 slices shown (1 of 2)]
[im 1/25]
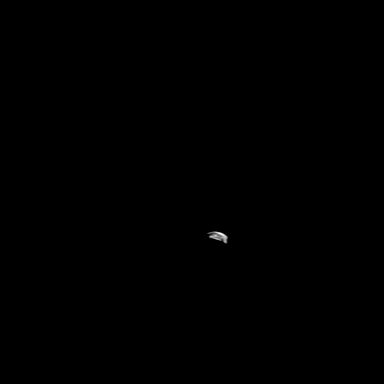

[Series 10: T2 · axial · 5.0mm · 0.53mm/px · 1 of 25 slices shown (1 of 2)]
[im 1/25]
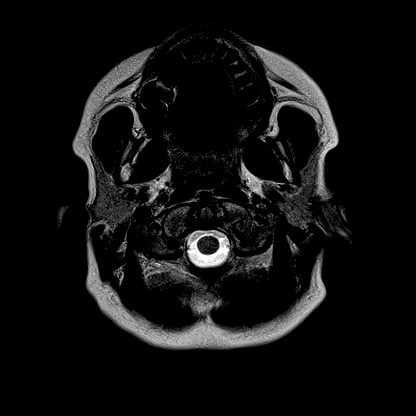

[Series 15: FLAIR · axial · 3.0mm · 0.53mm/px · z∈[-114,+47]mm · 3 of 55 slices shown]
[im 1/55]
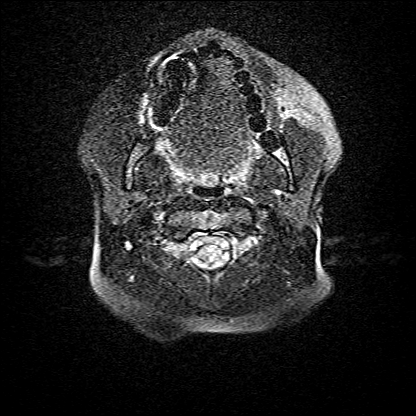
[im 28/55]
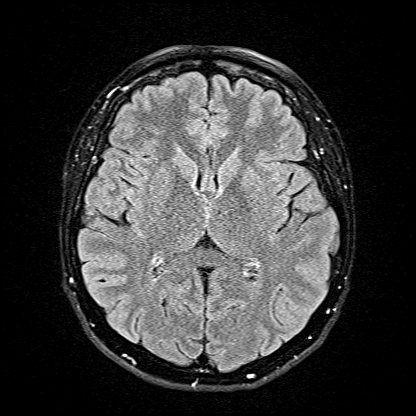
[im 55/55]
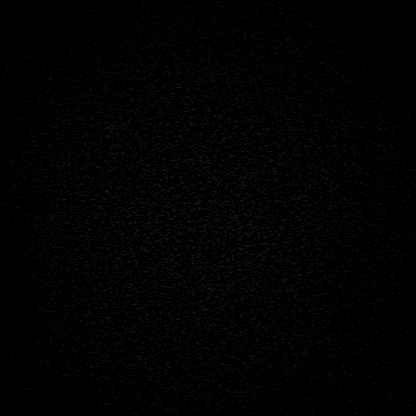

[Series 16: T1 · axial · 1.0mm · 0.98mm/px · z∈[-121,+53]mm · 8 of 176 slices shown (2 of 2)]
[im 1/176]
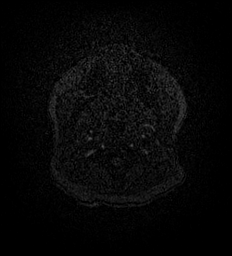
[im 26/176]
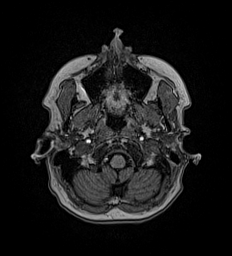
[im 51/176]
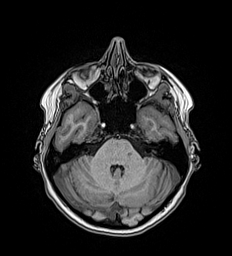
[im 76/176]
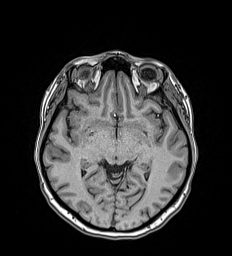
[im 101/176]
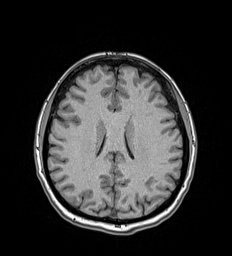
[im 126/176]
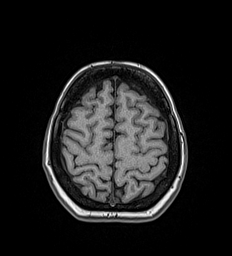
[im 151/176]
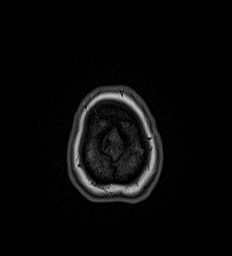
[im 176/176]
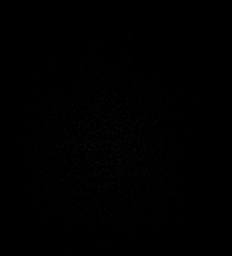

[Series 17: mag_images · axial · 3.0mm · 0.90mm/px · z∈[-118,+46]mm · 3 of 56 slices shown]
[im 1/56]
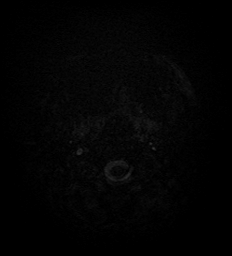
[im 28/56]
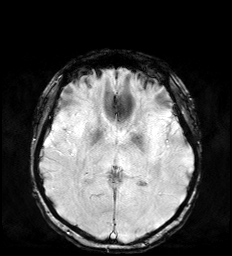
[im 56/56]
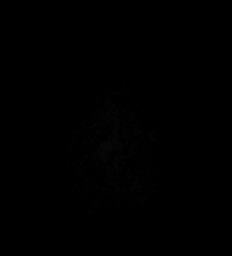

[Series 18: pha_images · axial · 3.0mm · 0.90mm/px · z∈[-118,+40]mm · 3 of 54 slices shown]
[im 1/54]
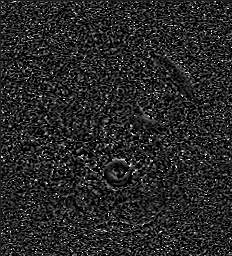
[im 27/54]
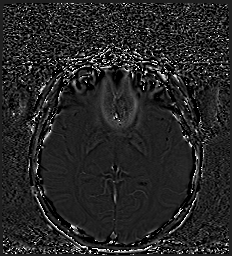
[im 54/54]
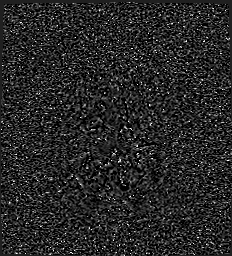

[Series 19: swi_images · axial · 3.0mm · 0.90mm/px · z∈[-118,+46]mm · 3 of 56 slices shown]
[im 1/56]
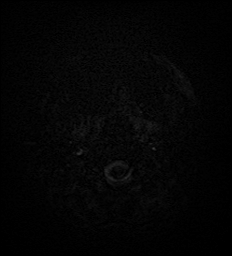
[im 28/56]
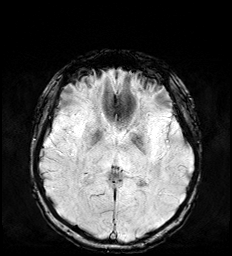
[im 56/56]
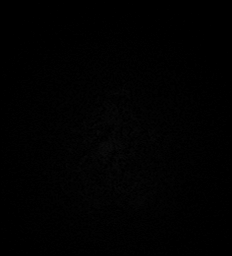

[Series 20: mip_images(sw) · axial · 24.0mm · 0.90mm/px · z∈[-108,+36]mm · 2 of 49 slices shown]
[im 1/49]
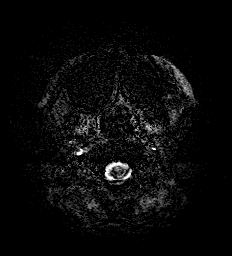
[im 49/49]
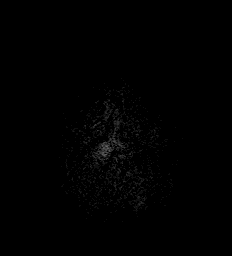

[Series 21: T2 · coronal · 5.0mm · 0.45mm/px · 1 of 31 slices shown (2 of 2)]
[im 1/31]
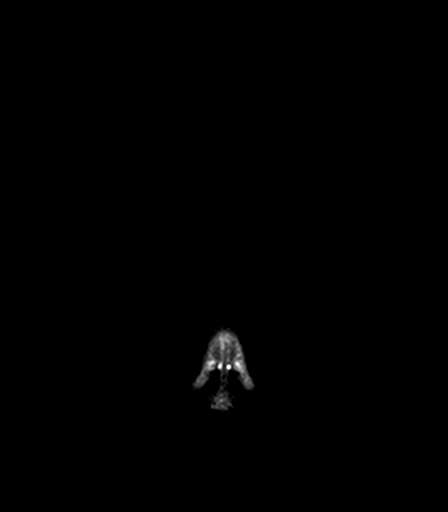

[Series 22: T1 post-contrast · axial · 1.0mm · 0.98mm/px · z∈[-121,+53]mm · 8 of 176 slices shown (1 of 2)]
[im 1/176]
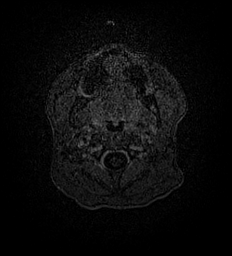
[im 26/176]
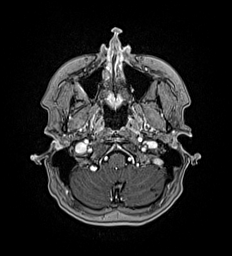
[im 51/176]
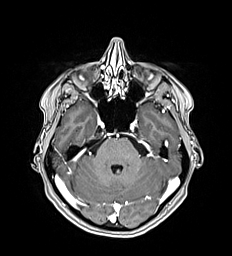
[im 76/176]
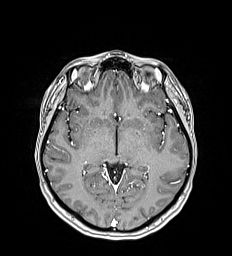
[im 101/176]
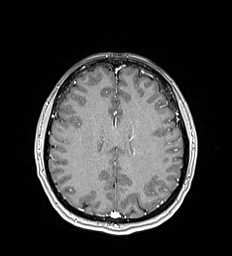
[im 126/176]
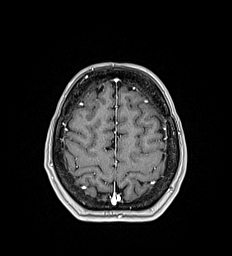
[im 151/176]
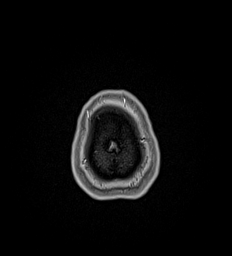
[im 176/176]
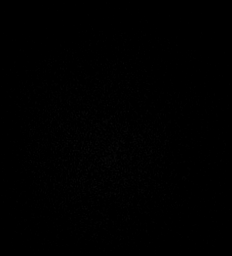

[Series 23: T1 post-contrast · coronal · 5.0mm · 0.57mm/px · 1 of 29 slices shown (2 of 2)]
[im 1/29]
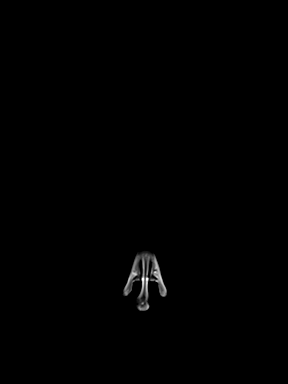

[48 of 48 positions shown; findings below may reference images not displayed]

FINDINGS: Brain: Focal area of susceptibility with mixed signal on the axial
T2 sequence again noted within the lateral left pons. This is not
significantly changed. No new lesions are present. Minimal focal
enhancement may represent associated developmental venous anomaly.
No other pathologic enhancement is present.

No acute infarct, hemorrhage, or mass lesion is present. The
ventricles are of normal size. No significant extraaxial fluid
collection is present.

The internal auditory canals are within normal limits. Remote
lacunar infarct of the left cerebellum is stable.

Vascular: Flow is present in the major intracranial arteries.

Skull and upper cervical spine: The craniocervical junction is
normal. Upper cervical spine is within normal limits. Marrow signal
is unremarkable.

Sinuses/Orbits: The paranasal sinuses and mastoid air cells are
clear. The globes and orbits are within normal limits.
IMPRESSION: 1. Stable appearance of focal area susceptibility in the lateral
left pons with minimal focal enhancement. This may represent a
cerebral cavernous venous malformation. A small developmental venous
anomaly is likely associated.
2. No acute intracranial abnormality or significant interval change.
3. Remote lacunar infarct of the left cerebellum.

## 2023-01-27 ENCOUNTER — Ambulatory Visit: Payer: BC Managed Care – PPO | Admitting: Obstetrics and Gynecology

## 2023-01-27 ENCOUNTER — Other Ambulatory Visit (HOSPITAL_COMMUNITY)
Admission: RE | Admit: 2023-01-27 | Discharge: 2023-01-27 | Disposition: A | Discharge status: Home / Self Care | Payer: BC Managed Care – PPO | Source: Ambulatory Visit | Attending: Obstetrics and Gynecology | Admitting: Obstetrics and Gynecology

## 2023-01-27 ENCOUNTER — Encounter: Payer: Self-pay | Admitting: Obstetrics and Gynecology

## 2023-01-27 VITALS — BP 127/85 | HR 92 | Resp 16 | Ht 63.0 in | Wt 148.6 lb

## 2023-01-27 DIAGNOSIS — N644 Mastodynia: Secondary | ICD-10-CM | POA: Diagnosis not present

## 2023-01-27 DIAGNOSIS — R102 Pelvic and perineal pain: Secondary | ICD-10-CM

## 2023-01-27 DIAGNOSIS — N926 Irregular menstruation, unspecified: Secondary | ICD-10-CM | POA: Diagnosis not present

## 2023-01-27 DIAGNOSIS — R61 Generalized hyperhidrosis: Secondary | ICD-10-CM

## 2023-01-27 LAB — POCT URINALYSIS DIPSTICK
Bilirubin, UA: NEGATIVE
Blood, UA: NEGATIVE
Glucose, UA: NEGATIVE
Ketones, UA: NEGATIVE
Leukocytes, UA: NEGATIVE
Nitrite, UA: NEGATIVE
Protein, UA: NEGATIVE
Spec Grav, UA: 1.015 (ref 1.010–1.025)
Urobilinogen, UA: 0.2 E.U./dL
pH, UA: 6 (ref 5.0–8.0)

## 2023-01-27 NOTE — Progress Notes (Signed)
GYNECOLOGY PROGRESS NOTE  Subjective:    Patient ID: Yesenia Moore, female    DOB: May 19, 1976, 47 y.o.   MRN: [id number]  HPI  Patient is a 47 y.o. G10P2002 female who presents for evaluation of breast tenderness and pelvic pain.  States that the breast pain has been ongoing for 4 days, bilateral, described as "pins and needles" sensation. Is gradually improving without any intervention.  Denies nipple discharge.   Also notes sharp pain in her abdomen.  States that it goes across her abdomen but is mostly on right side. States that her last cycle was "strange", was very short, only lasted 1 day then stopped, then resumed again 2-3 days later and was a more normal period of 3 days.  Occurred July 14th. Does also have some pain with urination. Thinks she may have had a low grade fever one day, 99 degrees.   Review of chart notes that patient has had right sided pelvic pain intermittently for years.   She was previously seen by Dr. Thomasene Mohair of [facility]. Last seen by him in January of this year at [facility]. Had an ER visit in January due to her pain, and was told that she had several small fibroids. Also was noted a 2.5 cm right adnexal cyst, which patient notes she was unware of.   The following portions of the patient's history were reviewed and updated as appropriate: allergies, current medications, past family history, past medical history, past social history, past surgical history, and problem list.  Review of Systems Pertinent items noted in HPI and remainder of comprehensive ROS otherwise negative.   Objective:   Blood pressure 127/85, pulse 92, resp. rate 16, height 5\' 3"  (1.6 m), weight 148 lb 9.6 oz (67.4 kg). Body mass index is 26.32 kg/m. General appearance: alert, cooperative, and no distress Abdomen: soft, non-tender; bowel sounds normal; no masses,  no organomegaly Pelvic: external genitalia normal, rectovaginal septum normal.  Vagina with small amount of  yellow-white discharge.  Cervix normal appearing, no lesions and no motion tenderness.  Uterus mobile, nontender, normal shape and size.  Adnexae with slightly palpable mass on the right, mildly tender.  No tenderness or masses appreciated on left.  Extremities: extremities normal, atraumatic, no cyanosis or edema Neurologic: Grossly normal   Labs:  Results for orders placed or performed in visit on 01/27/23  POCT Urinalysis Dipstick  Result Value Ref Range   Color, UA     Clarity, UA     Glucose, UA Negative Negative   Bilirubin, UA Negative    Ketones, UA Negative    Spec Grav, UA 1.015 1.010 - 1.025   Blood, UA Negative    pH, UA 6.0 5.0 - 8.0   Protein, UA Negative Negative   Urobilinogen, UA 0.2 0.2 or 1.0 E.U./dL   Nitrite, UA Negative    Leukocytes, UA Negative Negative   Appearance     Odor      Imaging:  US PELVIC COMPLETE W TRANSVAGINAL AND TORSION R/O CLINICAL DATA:  Initial evaluation for acute right lower quadrant abdominal pain.  EXAM: TRANSABDOMINAL AND TRANSVAGINAL ULTRASOUND OF PELVIS  DOPPLER ULTRASOUND OF OVARIES  TECHNIQUE: Both transabdominal and transvaginal ultrasound examinations of the pelvis were performed. Transabdominal technique was performed for global imaging of the pelvis including uterus, ovaries, adnexal regions, and pelvic cul-de-sac.  It was necessary to proceed with endovaginal exam following the transabdominal exam to visualize the endometrium. Color and duplex Doppler ultrasound was utilized to  evaluate blood flow to the ovaries.  COMPARISON:  Prior study from 12/06/2018.  FINDINGS: Uterus  Measurements: 8.5 x 4.5 x 4.9 cm = volume: 90.0 mL. Uterus is anteverted with several fibroids seen as follows;  1. 1.9 x 1.7 x 1.6 cm partially calcified exophytic fibroid seen at the uterine fundus. 2. 1.6 x 2.0 x 1.9 cm intramural fibroid present at the left anterior uterine body. 3. 2.1 x 2.2 x 1.4 cm exophytic fibroid present at  the posterior uterine body.  Endometrium  Thickness: 7.3 mm.  No focal abnormality visualized.  Right ovary  Measurements: 3.7 x 2.7 x 3.7 cm = volume: 90.0 mL. 2.4 x 2.6 x 2.0 cm mildly complex cyst with a few low-level internal echoes and single internal curvilinear septations, indeterminate. No internal vascularity or solid component.  Left ovary  Measurements: 2.1 x 1.4 x 2.2 cm = volume: 3.2 mL. Normal appearance/no adnexal mass.  Pulsed Doppler evaluation of both ovaries demonstrates normal low-resistance arterial and venous waveforms.  Other findings  No abnormal free fluid.  IMPRESSION: 1. 2.6 cm mildly complex right ovarian cyst. While this is favored to reflect a mildly complex follicular cyst or possibly hemorrhagic cyst, a short interval follow-up ultrasound in 6-12 weeks is suggested to ensure resolution. 2. No evidence for ovarian torsion. 3. Fibroid uterus as detailed above.  Electronically Signed   By: Rise Mu M.D.   On: 07/03/2022 19:38    Assessment:   1. Pelvic pain   2. Sweating increase   3. Menses, irregular   4. Breast tenderness in female      Plan:    1. Pelvic pain -Unclear cause at this time however patient has had a history of intermittent right sided pelvic pain for several years.  Does have a small right ovarian cyst noted on prior imaging in January.  Will order ultrasound to follow up to see if there has been any change in the cyst.  She also does have small fibroids however these are calcified and are likely not the cause of her pain.  Patient does report issues with her appendix in the past, has had a history of appendicitis however symptoms do not seem to be correlating with this diagnosis.  UA today negative to rule out UTI.  Vaginal cultures collected to rule out vaginitis/cervicitis.  -Advised on Tylenol or OTC NSAIDs to help treat her pain at this time. - POCT Urinalysis Dipstick - US PELVIC COMPLETE WITH  TRANSVAGINAL; Future - Cervicovaginal ancillary only  2. Sweating increase -Patient notes an increase in sweating recently.  I discussed that this could be related to her hormones as she may be approaching the perimenopausal phase, also could be related to her thyroid however patient notes having recent thyroid labs done which were normal (review of chart notes labs performed in [facility]).  Will order reproductive hormonal levels for assessment. - FSH/LH - Estradiol - Progesterone - US PELVIC COMPLETE WITH TRANSVAGINAL; Future  3. Menses, irregular -Patient notes an irregularity in her most recent menstrual cycle.  Again discussed that she could be entering the perimenopausal phase which would explain several of her issues.  Labs ordered today. - FSH/LH - Estradiol - Progesterone - US PELVIC COMPLETE WITH TRANSVAGINAL; Future  4. Breast tenderness in female -No tenderness noted however possible fibrocystic changes noted in left breast.  Patient notes that she has often had issues with feeling denser tissue on her left and in her axillary region with some  tenderness at times.  Advised on possible correlation with her menstrual cycles, encouraged patient to track her symptoms in the near future.  These also could be symptoms due to a dysregulation or dysfunction in her reproductive hormones.  I discussed that if her symptoms continue, would warrant further evaluation.  Patient will be due for screening mammography next month and encouraged patient to be sure to keep this appointment.   Patient to follow-up in 2 to 3 weeks after ultrasound to discuss findings and further management if needed.   A total of 45 minutes were spent during this encounter, including review of previous progress notes, recent imaging and labs, face-to-face with time with patient involving counseling and coordination of care, as well as documentation for current visit.   Hildred Laser, MD [place]  [facility] at [facility]

## 2023-02-10 ENCOUNTER — Other Ambulatory Visit: Payer: BC Managed Care – PPO

## 2023-02-10 ENCOUNTER — Telehealth: Payer: Self-pay | Admitting: Obstetrics and Gynecology

## 2023-02-10 NOTE — Telephone Encounter (Signed)
Reached out to pt to reschedule Gyn [place] that was scheduled on 02/10/2023 at 11:15 Per Dr. Valentino Saxon.  Left message for pt to call back to reschedule.

## 2023-02-11 NOTE — Telephone Encounter (Signed)
Arlisha H. rescheduled the pt's GYN [place] for 03/09/2023 at 11:15.

## 2023-02-15 ENCOUNTER — Ambulatory Visit: Payer: BC Managed Care – PPO | Admitting: Obstetrics and Gynecology

## 2023-03-09 ENCOUNTER — Ambulatory Visit (INDEPENDENT_AMBULATORY_CARE_PROVIDER_SITE_OTHER): Payer: BC Managed Care – PPO

## 2023-03-09 ENCOUNTER — Other Ambulatory Visit: Payer: Self-pay | Admitting: Obstetrics and Gynecology

## 2023-03-09 DIAGNOSIS — R102 Pelvic and perineal pain: Secondary | ICD-10-CM | POA: Diagnosis not present

## 2023-03-09 DIAGNOSIS — R61 Generalized hyperhidrosis: Secondary | ICD-10-CM

## 2023-03-09 DIAGNOSIS — N644 Mastodynia: Secondary | ICD-10-CM

## 2023-03-09 DIAGNOSIS — N926 Irregular menstruation, unspecified: Secondary | ICD-10-CM

## 2023-04-28 ENCOUNTER — Ambulatory Visit: Payer: BC Managed Care – PPO | Admitting: Dermatology

## 2023-04-28 ENCOUNTER — Encounter: Payer: Self-pay | Admitting: Dermatology

## 2023-04-28 DIAGNOSIS — L309 Dermatitis, unspecified: Secondary | ICD-10-CM

## 2023-04-28 DIAGNOSIS — H01119 Allergic dermatitis of unspecified eye, unspecified eyelid: Secondary | ICD-10-CM

## 2023-04-28 DIAGNOSIS — H019 Unspecified inflammation of eyelid: Secondary | ICD-10-CM | POA: Diagnosis not present

## 2023-04-28 DIAGNOSIS — Z7189 Other specified counseling: Secondary | ICD-10-CM

## 2023-04-28 DIAGNOSIS — Z79899 Other long term (current) drug therapy: Secondary | ICD-10-CM

## 2023-04-28 MED ORDER — PIMECROLIMUS 1 % EX CREA: TOPICAL_CREAM | CUTANEOUS | 2 refills | Status: DC

## 2023-04-28 NOTE — Patient Instructions (Signed)
Start Pimecrolimus cream twice daily to affected eyelids.   Opzelura sample given to be applied twice daily to use until Pimecrolimus (Elidel) is approved.     Gentle Skin Care Guide  1. Bathe no more than once a day.  2. Avoid bathing in hot water  3. Use a mild soap like Dove, Vanicream, Cetaphil, CeraVe. Can use Lever 2000 or Cetaphil antibacterial soap  4. Use soap only where you need it. On most days, use it under your arms, between your legs, and on your feet. Let the water rinse other areas unless visibly dirty.  5. When you get out of the bath/shower, use a towel to gently blot your skin dry, don't rub it.  6. While your skin is still a little damp, apply a moisturizing cream such as Vanicream, CeraVe, Cetaphil, Eucerin, Sarna lotion or plain Vaseline Jelly. For hands apply Neutrogena [place] Hand Cream or Excipial Hand Cream.  7. Reapply moisturizer any time you start to itch or feel dry.  8. Sometimes using free and clear laundry detergents can be helpful. Fabric softener sheets should be avoided. Downy Free & Gentle liquid, or any liquid fabric softener that is free of dyes and perfumes, it acceptable to use  9. If your doctor has given you prescription creams you may apply moisturizers over them       Due to recent changes in healthcare laws, you may see results of your pathology and/or laboratory studies on MyChart before the doctors have had a chance to review them. We understand that in some cases there may be results that are confusing or concerning to you. Please understand that not all results are received at the same time and often the doctors may need to interpret multiple results in order to provide you with the best plan of care or course of treatment. Therefore, we ask that you please give [place] 2 business days to thoroughly review all your results before contacting the office for clarification. Should we see a critical lab result, you will be contacted  sooner.   If You Need Anything After Your Visit  If you have any questions or concerns for your doctor, please call our main line at [phone] and press option 4 to reach your doctor's medical assistant. If no one answers, please leave a voicemail as directed and we will return your call as soon as possible. Messages left after 4 pm will be answered the following business day.   You may also send [place] a message via MyChart. We typically respond to MyChart messages within 1-2 business days.  For prescription refills, please ask your pharmacy to contact our office. Our fax number is [phone].  If you have an urgent issue when the clinic is closed that cannot wait until the next business day, you can page your doctor at the number below.    Please note that while we do our best to be available for urgent issues outside of office hours, we are not available 24/7.   If you have an urgent issue and are unable to reach [place], you may choose to seek medical care at your doctor's office, retail clinic, urgent care center, or emergency room.  If you have a medical emergency, please immediately call 911 or go to the emergency department.  Pager Numbers  - Dr. Gwen Pounds: [phone]  - Dr. Roseanne Reno: [phone]  - Dr. Katrinka Blazing: [phone]   In the event of inclement weather, please call our main line at [phone] for an update  on the status of any delays or closures.  Dermatology Medication Tips: Please keep the boxes that topical medications come in in order to help keep track of the instructions about where and how to use these. Pharmacies typically print the medication instructions only on the boxes and not directly on the medication tubes.   If your medication is too expensive, please contact our office at [phone] option 4 or send [place] a message through MyChart.   We are unable to tell what your co-pay for medications will be in advance as this is different depending on your  insurance coverage. However, we may be able to find a substitute medication at lower cost or fill out paperwork to get insurance to cover a needed medication.   If a prior authorization is required to get your medication covered by your insurance company, please allow [place] 1-2 business days to complete this process.  Drug prices often vary depending on where the prescription is filled and some pharmacies may offer cheaper prices.  The website www.goodrx.com contains coupons for medications through different pharmacies. The prices here do not account for what the cost may be with help from insurance (it may be cheaper with your insurance), but the website can give you the price if you did not use any insurance.  - You can print the associated coupon and take it with your prescription to the pharmacy.  - You may also stop by our office during regular business hours and pick up a GoodRx coupon card.  - If you need your prescription sent electronically to a different pharmacy, notify our office through [facility] or by phone at [phone] option 4.     Si Usted Necesita Algo Despus de Su Visita  Tambin puede enviarnos un mensaje a travs de Clinical cytogeneticist. Por lo general respondemos a los mensajes de MyChart en el transcurso de 1 a 2 das hbiles.  Para renovar recetas, por favor pida a su farmacia que se ponga en contacto con nuestra oficina. Annie Sable de fax es [place] [phone].  Si tiene un asunto urgente cuando la clnica est cerrada y que no puede esperar hasta el siguiente da hbil, puede llamar/localizar a su doctor(a) al nmero que aparece a continuacin.   Por favor, tenga en cuenta que aunque hacemos todo lo posible para estar disponibles para asuntos urgentes fuera del horario de [place], no estamos disponibles las 24 horas del da, los 7 [address]  [address] de la [place].   Si tiene un problema urgente y no puede comunicarse con nosotros, puede optar por buscar atencin mdica  en el  consultorio de su doctor(a), en una clnica privada, en un centro de atencin urgente o en una sala de emergencias.  Si tiene Engineer, drilling, por favor llame inmediatamente al 911 o vaya a la sala de emergencias.  Nmeros de bper  - Dr. Gwen Pounds: [phone]  - Dra. Roseanne Reno: [phone]  - Dr. Katrinka Blazing: [phone]   En caso de inclemencias del tiempo, por favor llame a Lacy Duverney principal al [phone] para una actualizacin sobre el [place] de cualquier retraso o cierre.  Consejos para la medicacin en dermatologa: Por favor, guarde las cajas en las que vienen los medicamentos de uso tpico para ayudarle a seguir las instrucciones sobre dnde y cmo usarlos. Las farmacias generalmente imprimen las instrucciones del medicamento slo en las cajas y no directamente en los tubos del [place].   Si su medicamento es [employer], por favor, pngase en contacto con nuestra oficina llamando  al [phone] y presione la opcin 4 o envenos un mensaje a travs de Clinical cytogeneticist.   No podemos decirle cul ser su copago por los medicamentos por adelantado ya que esto es diferente dependiendo de la cobertura de su seguro. Sin embargo, es posible que podamos encontrar un medicamento sustituto a Audiological scientist un formulario para que el seguro cubra el medicamento que se considera necesario.   Si se requiere una autorizacin previa para que su compaa de seguros [place] su medicamento, por favor permtanos de 1 a 2 das hbiles para completar [address]  Los precios de los medicamentos varan con frecuencia dependiendo del Environmental consultant de dnde se surte la receta y alguna farmacias pueden ofrecer precios ms baratos.  El sitio web www.goodrx.com tiene cupones para medicamentos de Health and safety inspector. Los precios aqu no tienen en cuenta lo que podra costar con la ayuda del seguro (puede ser ms barato con su seguro), pero el sitio web puede darle el precio si no utiliz Tourist information centre manager.  -  Puede imprimir el cupn correspondiente y llevarlo con su receta a la farmacia.  - Tambin puede pasar por nuestra oficina durante el horario de atencin regular y Education officer, museum una tarjeta de cupones de GoodRx.  - Si necesita que su receta se enve electrnicamente a una farmacia diferente, informe a nuestra oficina a travs de MyChart de [place] o por telfono llamando al [phone] y presione la opcin 4.

## 2023-04-28 NOTE — Progress Notes (Signed)
   Follow Up Visit   Subjective  Yesenia Moore is a 47 y.o. female who presents for the following: Rash at eyelids. Right worse than left. Dur: 2 weeks, coming and going but worse yesterday. Itching, irritated. Eye is itching and burning as well. Has used Aquaphor, was uncomfortable.   The following portions of the chart were reviewed this encounter and updated as appropriate: medications, allergies, medical history  Review of Systems:  No other skin or systemic complaints except as noted in HPI or Assessment and Plan.  Objective  Well appearing patient in no apparent distress; mood and affect are within normal limits.  A focused examination was performed of the following areas: Eyelids, eyes  Relevant exam findings are noted in the Assessment and Plan.           Assessment & Plan   Dermatitis  Related Procedures Patch Test   Eyelid Dermatitis Exam: erythema and edema at R upper and lower eyelid  Can be related to allergies, cosmetics, eczema, pollens,.  Can do patch testing if does not improve with treatment.  Treatment Plan: Start Pimecrolimus cream twice daily to affected eyelids.   Opzelura sample given to be applied twice daily to use until Pimecrolimus (Elidel) is approved.   Patient requests patch testing. Will schedule for next available.  Return for Patch Testing, Next Available, rash follow up with Dr. Gwen Pounds 2 weeks after patch testing.  I, Lawson Radar, CMA, am acting as scribe for Armida Sans, MD.   Documentation: I have reviewed the above documentation for accuracy and completeness, and I agree with the above.  Armida Sans, MD

## 2023-05-24 ENCOUNTER — Ambulatory Visit: Payer: BC Managed Care – PPO

## 2023-05-26 ENCOUNTER — Ambulatory Visit: Payer: BC Managed Care – PPO

## 2023-05-30 ENCOUNTER — Telehealth: Payer: Self-pay

## 2023-05-30 ENCOUNTER — Ambulatory Visit (INDEPENDENT_AMBULATORY_CARE_PROVIDER_SITE_OTHER): Payer: BC Managed Care – PPO

## 2023-05-30 DIAGNOSIS — L309 Dermatitis, unspecified: Secondary | ICD-10-CM | POA: Diagnosis not present

## 2023-05-30 NOTE — Progress Notes (Signed)
Patch testing was performed on 05/30/2023 using standard technique. True test x 36 applied to back at visit today. Pt advised to keep panels dry until removal in 2 days for first read.    Dorathy Daft, RMA

## 2023-05-30 NOTE — Telephone Encounter (Signed)
Patient came into the office today to start patch testing. Patient states the Pimecrolimus and Opzelura are not helping and burns. Patient would like alternative medication.

## 2023-05-31 ENCOUNTER — Ambulatory Visit: Payer: BC Managed Care – PPO | Admitting: Dermatology

## 2023-05-31 MED ORDER — MOMETASONE FUROATE 0.1 % EX OINT: TOPICAL_OINTMENT | CUTANEOUS | 0 refills | Status: DC

## 2023-05-31 NOTE — Telephone Encounter (Signed)
Patient advised and RX sent in. aw 

## 2023-06-01 ENCOUNTER — Ambulatory Visit: Payer: BC Managed Care – PPO

## 2023-06-01 DIAGNOSIS — L309 Dermatitis, unspecified: Secondary | ICD-10-CM

## 2023-06-01 NOTE — Progress Notes (Signed)
Patient here today for day 3 of patch test reading. Patient had reaction to site number #28 for gold. Patient was very red from adhesive so I did let her sit for 10 minutes before taking photos or doing an additional read. Patient had questionable reaction to site #1.  Patient advised do not soak nor scrub and how to read testing site through the weekend.   Dorathy Daft, RMA

## 2023-06-07 ENCOUNTER — Ambulatory Visit: Payer: BC Managed Care – PPO | Admitting: Dermatology

## 2023-06-07 DIAGNOSIS — H01119 Allergic dermatitis of unspecified eye, unspecified eyelid: Secondary | ICD-10-CM

## 2023-06-07 DIAGNOSIS — Z79899 Other long term (current) drug therapy: Secondary | ICD-10-CM

## 2023-06-07 DIAGNOSIS — Z7189 Other specified counseling: Secondary | ICD-10-CM | POA: Diagnosis not present

## 2023-06-07 DIAGNOSIS — L719 Rosacea, unspecified: Secondary | ICD-10-CM | POA: Diagnosis not present

## 2023-06-07 NOTE — Patient Instructions (Signed)

## 2023-06-07 NOTE — Progress Notes (Signed)
   Follow-Up Visit   Subjective  Yesenia Moore is a 47 y.o. female who presents for the following:patient here today for follow up about rash at b/l eyelids. Patient was seen in November concerning rash and prescribed Elidel ointment and Opzelura. She reports neither cream / ointment helped and burned when applying. She has been seen for patch testing here and had positive reaction to gold, question reaction to #1 allergy noted.  A rx of mometasone ointment was given to use at areas twice a day for 5 days a week as needed. Patient reports cream did help some but rash did not go away.   Patient here today to discuss results.   The patient has spots, moles and lesions to be evaluated, some may be new or changing and the patient may have concern these could be cancer.   The following portions of the chart were reviewed this encounter and updated as appropriate: medications, allergies, medical history  Review of Systems:  No other skin or systemic complaints except as noted in HPI or Assessment and Plan.  Objective  Well appearing patient in no apparent distress; mood and affect are within normal limits.    A focused examination was performed of the following areas: B/l eyelids  Relevant exam findings are noted in the Assessment and Plan.    Assessment & Plan   Eyelid Dermatitis Likely 2ndary to + patch test reactions to Gold (most likely causing eyelid dermatitis) Nickel Neomycin  Exam:   nickle reaction and neomycin  Positive  Gold   Treatment Plan: Positive reaction to Gold allergy Positive reaction to Nickle allergy Positive reaction to Neomycin allergy    Information given to patient regarding positive reaction to these allergies. Discussed the need to no longer wear or use products with Gold, Nickle or Neomycin.   Discussed if continues to use / or wear said allergies will continue to breakout with rash.   Can take up to 2 months to clear   History of  possible  food allergies -  Pt woud like Allergist referral Patient reports having other allergies and believes she has allergies to certain foods that could be causing rash.   Patient would like referral sent to Allergist for further evaluation of possible allergy to foods.   Rosacea Exam: mild erythema at malar cheeks and nose  Chronic and persistent condition with duration or expected duration over one year. Condition is improving with treatment but not currently at goal.  Rosacea is a chronic progressive skin condition usually affecting the face of adults, causing redness and/or acne bumps. It is treatable but not curable. It sometimes affects the eyes (ocular rosacea) as well. It may respond to topical and/or systemic medication and can flare with stress, sun exposure, alcohol, exercise and some foods.  Daily application of broad spectrum spf 30+ sunscreen to face is recommended to reduce flares.   Cont SM metronidazole/ivermectin/azelaic acid cr qd/bid to face 30g 10 rf   Apply 1 Application topically at bedtime. Qhs to face for Rosacea  EYELID DERMATITIS, ALLERGIC/CONTACT, UNSPECIFIED LATERALITY   Related Procedures Ambulatory referral to Allergy  Return if symptoms worsen or fail to improve.  IAsher Muir, CMA, am acting as scribe for Armida Sans, MD.   Documentation: I have reviewed the above documentation for accuracy and completeness, and I agree with the above.  Armida Sans, MD

## 2023-06-10 ENCOUNTER — Encounter: Payer: Self-pay | Admitting: Dermatology

## 2023-07-05 ENCOUNTER — Other Ambulatory Visit: Payer: Self-pay

## 2023-07-05 ENCOUNTER — Encounter: Payer: Self-pay | Admitting: Allergy and Immunology

## 2023-07-05 ENCOUNTER — Ambulatory Visit: Payer: BC Managed Care – PPO | Admitting: Allergy and Immunology

## 2023-07-05 VITALS — BP 110/78 | HR 97 | Temp 98.5°F | Resp 14 | Ht 62.99 in | Wt 152.5 lb

## 2023-07-05 DIAGNOSIS — L989 Disorder of the skin and subcutaneous tissue, unspecified: Secondary | ICD-10-CM

## 2023-07-05 MED ORDER — METRONIDAZOLE 0.75 % EX CREA: TOPICAL_CREAM | Freq: Every day | CUTANEOUS | 1 refills | Status: DC | PRN

## 2023-07-05 MED ORDER — OPZELURA 1.5 % EX CREA: 1.0000 | TOPICAL_CREAM | Freq: Every day | CUTANEOUS | 1 refills | Status: AC

## 2023-07-05 NOTE — Progress Notes (Signed)
 [place] - [place] - [place] - [place] - [place]   Dear Dr. Hester,  Thank you for referring Yesenia Moore to the [facility] [place]  on 07/05/2023.   Below is a summation of this patient's evaluation and recommendations.  Thank you for your referral. I will keep you informed about this patient's response to treatment.   If you have any questions please do not hesitate to contact me.   Sincerely,  Camellia DOROTHA Denis, MD Allergy  / Immunology [facility] [place]    ______________________________________________________________________    NEW PATIENT NOTE  Referring Provider: Hester Alm BROCKS, MD Primary Provider: Sadie Manna, MD Date of office visit: 07/05/2023    Subjective:   Chief Complaint:  Yesenia Moore (DOB: 1976-04-11) is a 48 y.o. female who presents to the clinic on 07/05/2023 with a chief complaint of Rash (Wants to be checked for allergies to gold. ) and Allergy  Testing (Wants to check to see if she has food allergies. Not sure if dermatologist checked for seasonal allergies. ) .     HPI: Yesenia Moore presents to this clinic in evaluation of dermatitis.  Her history dates back to early 2020 at which point in time she started develop red itchy lesions around her mouth and around her eyes and basically across her face.  Over the course of the past 3 years she has had recurrent problems with the area around her mouth and she would develop these red patches that were itchy and they would wax and wane.  She was given some topical steroids for this issue and she would utilize those agents about 5 days out of the month.  Then in November 2024 she had an explosion of her dermatitis involving her periorbital region.  This apparently took several different topical agents to get under control including topical steroid and calcineurin inhibitor.  Patch testing was performed which identified  hypersensitivity against gold, nickel, and topical antibiotic all of which has been eliminated from her environment.  She is at a point now where she must use her topical agents every week to every other week and she has never really seen complete clearing of the redness and irritation of her face.  When thinking about possible triggers giving rise to the inflammation of the skin on her face she thinks that eating citrus and eating garlic may be contributing to this issue.  As well, she did obtain 2 cats around the same point in time in which her dermatitis started.  Past Medical History:  Diagnosis Date   Anemia    Diabetes mellitus without complication (HCC)    Gestational diabetes   Hashimoto's thyroiditis    Thyroid  disease     Past Surgical History:  Procedure Laterality Date   CESAREAN SECTION N/A 11/07/2009   CESAREAN SECTION N/A 03/27/2015   Procedure: CESAREAN SECTION;  Surgeon: Garnette JONETTA Mace, MD;  Location: [facility] ORS;  Service: Obstetrics;  Laterality: N/A;   CESAREAN SECTION      Allergies as of 07/05/2023       Reactions   Lidocaine Other (See Comments)   fast heart beat, dizziness        Medication List    pimecrolimus  1 % cream Commonly known as: Elidel     Accu-Chek FastClix Lancets Misc Apply 1 Lancet topically daily.   levothyroxine  75 MCG tablet Commonly known as: SYNTHROID  Take 75 mcg by mouth daily before breakfast.   metroNIDAZOLE  0.75 % cream Commonly  known as: MetroCream  Apply topically daily as needed. Started by: Khole Arterburn J Evgenia Merriman   mometasone  0.1 % ointment Commonly known as: ELOCON  Apply topically as directed. Apply thin layer to affect area once daily up to 5 days per week   Opzelura  1.5 % Crea Generic drug: Ruxolitinib Phosphate  Apply 1 Application topically daily. Apply daily to affected areas Started by: Jaron Czarnecki J Valkyrie Guardiola   Precision QID Test test strip Generic drug: glucose blood 1 each by Other route as needed.   Procysbi 300  MG Pack Generic drug: Cysteamine Bitartrate 1 Lancet by Other route daily.    Review of systems negative except as noted in HPI / PMHx or noted below:  Review of Systems  Constitutional: Negative.   HENT: Negative.    Eyes: Negative.   Respiratory: Negative.    Cardiovascular: Negative.   Gastrointestinal: Negative.   Genitourinary: Negative.   Musculoskeletal: Negative.   Skin: Negative.   Neurological: Negative.   Endo/Heme/Allergies: Negative.   Psychiatric/Behavioral: Negative.      Family History  Problem Relation Age of Onset   Diabetes Paternal Grandmother    Breast cancer Neg Hx    Ovarian cancer Neg Hx     Social History   Socioeconomic History   Marital status: Married    Spouse name: Not on file   Number of children: 2   Years of education: Not on file   Highest education level: Not on file  Occupational History   Not on file  Tobacco Use   Smoking status: Never    Passive exposure: Never   Smokeless tobacco: Never  Vaping Use   Vaping status: Never Used  Substance and Sexual Activity   Alcohol use: Yes    Alcohol/week: 2.0 standard drinks of alcohol    Types: 2 Glasses of wine per week    Comment: occasional   Drug use: No   Sexual activity: Yes    Birth control/protection: Condom  Other Topics Concern   Not on file  Social History Narrative   Not on file   Environmental and Social history  Lives in a house with a dry environment, cats located inside the household, no carpet in the bedroom, no plastic on the bed, no plastic on the pillow, no smoking with inside the household.  Objective:   Vitals:   07/05/23 0957  BP: 110/78  Pulse: 97  Resp: 14  Temp: 98.5 F (36.9 C)  SpO2: 98%   Height: 5' 2.99 (160 cm) Weight: 152 lb 8 oz (69.2 kg)  Physical Exam Constitutional:      Appearance: She is not diaphoretic.  HENT:     Head: Normocephalic.     Right Ear: Tympanic membrane, ear canal and external ear normal.     Left Ear:  Tympanic membrane, ear canal and external ear normal.     Nose: Nose normal. No mucosal edema or rhinorrhea.     Mouth/Throat:     Pharynx: Uvula midline. No oropharyngeal exudate.  Eyes:     Conjunctiva/sclera: Conjunctivae normal.  Neck:     Thyroid : No thyromegaly.     Trachea: Trachea normal. No tracheal tenderness or tracheal deviation.  Cardiovascular:     Rate and Rhythm: Normal rate and regular rhythm.     Heart sounds: Normal heart sounds, S1 normal and S2 normal. No murmur heard. Pulmonary:     Effort: No respiratory distress.     Breath sounds: Normal breath sounds. No stridor. No wheezing or rales.  Lymphadenopathy:  Head:     Right side of head: No tonsillar adenopathy.     Left side of head: No tonsillar adenopathy.     Cervical: No cervical adenopathy.  Skin:    Findings: Rash (Patchy erythema and minute papule and cystic lesions forehead, periorbital region, perioral region, cheeks) present. No erythema.     Nails: There is no clubbing.  Neurological:     Mental Status: She is alert.     Diagnostics: Allergy  skin tests were not performed.   Assessment and Plan:    1. Inflammatory dermatosis    1. Return for skin testing (no antihistamines)  2. Every day use the following combination of topical agents    A. Metrocream  - apply daily  B. Opzulera - apply daily  Yesenia Moore has some form of inflammatory dermatosis across his face.  Certainly this could be a form of atopic dermatitis and there does appear to be a temporal relationship between introducing cats inside the household and development of this dermatitis and we will work through that issue when she returns to this clinic for skin testing.  She is also interested in seeing if she has a hypersensitivity directed against specific foods and we will also skin test her to those foods when she returns.  She will use a combination of topical metronidazole  and Jak inhibitor on a daily basis in an attempt to drive  all her skin inflammation to nonexistent and then hopefully we can just use a preventative plan at lower dosages of anti-inflammatory agents to keep this issue at bay.  If she does not respond to this treatment and there does not appear to be an atopic trigger giving rise to this dermatitis then she may benefit from a course of doxycycline  as a more aggressive form of therapy directed against rosacea.  I will see her back in this clinic for skin testing.  Camellia DOROTHA Denis, MD Allergy  / Immunology [facility] [place] 

## 2023-07-05 NOTE — Patient Instructions (Addendum)
  1. Return for skin testing (no antihistamines)  2. Every day use the following combination of topical agents    A. Metrocream - apply daily  B. Opzulera - apply daily

## 2023-07-06 ENCOUNTER — Encounter: Payer: Self-pay | Admitting: Allergy and Immunology

## 2023-07-07 ENCOUNTER — Other Ambulatory Visit: Payer: Self-pay

## 2023-07-07 MED ORDER — METRONIDAZOLE 0.75 % EX CREA: TOPICAL_CREAM | Freq: Every day | CUTANEOUS | 1 refills | Status: AC | PRN

## 2023-07-11 ENCOUNTER — Other Ambulatory Visit: Payer: Self-pay | Admitting: Dermatology

## 2023-07-11 ENCOUNTER — Other Ambulatory Visit: Payer: Self-pay | Admitting: Internal Medicine

## 2023-07-11 DIAGNOSIS — Z1231 Encounter for screening mammogram for malignant neoplasm of breast: Secondary | ICD-10-CM

## 2023-07-19 ENCOUNTER — Ambulatory Visit: Payer: BC Managed Care – PPO | Admitting: Allergy and Immunology

## 2023-07-19 DIAGNOSIS — L989 Disorder of the skin and subcutaneous tissue, unspecified: Secondary | ICD-10-CM

## 2023-07-19 MED ORDER — MOMETASONE FUROATE 0.1 % EX OINT: TOPICAL_OINTMENT | CUTANEOUS | 1 refills | Status: AC

## 2023-07-20 ENCOUNTER — Encounter: Payer: Self-pay | Admitting: Allergy and Immunology

## 2023-07-20 NOTE — Progress Notes (Signed)
Yesenia Moore returns to this clinic to have skin testing performed.  She did not demonstrate any hypersensitivity against a screening panel of foods.  She did demonstrate hypersensitivity against tree pollen on a panel of aeroallergens.  Allergen avoidance measures were provided.  I encouraged her to consistently use MetroCream in a preventative manner at least 1 time per day and she can increase to twice a day should she develop a flare of her inflammatory dermatosis involving her face.

## 2023-07-22 NOTE — Addendum Note (Signed)
Addended by: Glena Norfolk on: 07/22/2023 08:39 AM   Modules accepted: Orders

## 2023-10-13 ENCOUNTER — Ambulatory Visit
Admission: RE | Admit: 2023-10-13 | Discharge: 2023-10-13 | Disposition: A | Discharge status: Home / Self Care | Source: Ambulatory Visit | Attending: Internal Medicine | Admitting: Internal Medicine

## 2023-10-13 DIAGNOSIS — Z1231 Encounter for screening mammogram for malignant neoplasm of breast: Secondary | ICD-10-CM | POA: Insufficient documentation

## 2023-10-18 ENCOUNTER — Other Ambulatory Visit: Payer: Self-pay | Admitting: Internal Medicine

## 2023-10-18 DIAGNOSIS — R928 Other abnormal and inconclusive findings on diagnostic imaging of breast: Secondary | ICD-10-CM

## 2023-10-19 ENCOUNTER — Ambulatory Visit
Admission: RE | Admit: 2023-10-19 | Discharge: 2023-10-19 | Disposition: A | Discharge status: Home / Self Care | Source: Ambulatory Visit | Attending: Internal Medicine | Admitting: Internal Medicine

## 2023-10-19 DIAGNOSIS — R928 Other abnormal and inconclusive findings on diagnostic imaging of breast: Secondary | ICD-10-CM | POA: Diagnosis present

## 2023-11-07 ENCOUNTER — Encounter: Payer: Self-pay | Admitting: Obstetrics & Gynecology

## 2023-11-07 ENCOUNTER — Ambulatory Visit: Admitting: Obstetrics & Gynecology

## 2023-11-07 VITALS — BP 124/85 | HR 97 | Ht 63.0 in | Wt 149.0 lb

## 2023-11-07 DIAGNOSIS — Z01419 Encounter for gynecological examination (general) (routine) without abnormal findings: Secondary | ICD-10-CM

## 2023-11-07 NOTE — Progress Notes (Signed)
 GYNECOLOGY ANNUAL PHYSICAL EXAM PROGRESS NOTE  Subjective:    Yesenia Moore is a 48 y.o.married [id number] (56 yo son and 70 yo daughter) who presents for an annual exam.  The patient is sexually active. The patient participates in regular exercise: yes. Has the patient ever been transfused or tattooed?: no. The patient reports that there is not domestic violence in her life.   The patient has the following complaints today: She is concerned by her fatigue, forgetfullness, and increased sweating with odor (not hot flashes or night sweats). Her primary care has evaluated this with normal TSH, normal FSH  Menstrual History:  Patient's last menstrual period was 10/28/2023. Period Cycle (Days): 30 Period Duration (Days): 6 Period Pattern: Regular Menstrual Flow: Light Dysmenorrhea: (!) Mild Dysmenorrhea Symptoms: Cramping, Headache   Gynecologic History:  Contraception: condoms History of STI's:  Last Pap: 2025. Results were: normal.  smears. Last mammogram: 07/2023. Results were: normal       OB History  Gravida Para Term Preterm AB Living  2 2 2  0 0 2  SAB IAB Ectopic Multiple Live Births  0 0 0 0 2    # Outcome Date GA Lbr Len/2nd Weight Sex Type Anes PTL Lv  2 Term 03/27/15 [redacted]w[redacted]d  8 lb 7.8 oz (3.85 kg) M CS-LTranv Spinal  LIV     Name: Klunder,BOY Avalie     Apgar1: 8  Apgar5: 9  1 Term      CS-Classical       Past Medical History:  Diagnosis Date   Anemia    Diabetes mellitus without complication (HCC)    Gestational diabetes   Hashimoto's thyroiditis    Thyroid  disease     Past Surgical History:  Procedure Laterality Date   CESAREAN SECTION N/A 11/07/2009   CESAREAN SECTION N/A 03/27/2015   Procedure: CESAREAN SECTION;  Surgeon: Kris Pester, MD;  Location: [facility] ORS;  Service: Obstetrics;  Laterality: N/A;   CESAREAN SECTION      Family History  Problem Relation Age of Onset   Diabetes Paternal Grandmother    Breast cancer Neg Hx    Ovarian cancer  Neg Hx     Social History   Socioeconomic History   Marital status: Married    Spouse name: Not on file   Number of children: 2   Years of education: Not on file   Highest education level: Not on file  Occupational History   Not on file  Tobacco Use   Smoking status: Never    Passive exposure: Never   Smokeless tobacco: Never  Vaping Use   Vaping status: Never Used  Substance and Sexual Activity   Alcohol use: Yes    Alcohol/week: 2.0 standard drinks of alcohol    Types: 2 Glasses of wine per week    Comment: occasional   Drug use: No   Sexual activity: Yes    Birth control/protection: Condom  Other Topics Concern   Not on file  Social History Narrative   Not on file   Social Drivers of Health   Financial Resource Strain: Low Risk  (08/18/2023)   Received from [facility]   Overall Financial Resource Strain (CARDIA)    Difficulty of Paying Living Expenses: Not hard at all  Food Insecurity: No Food Insecurity (08/18/2023)   Received from [facility]   Hunger Vital Sign    Worried About Running Out of Food in the Last Year: Never true  Ran Out of Food in the Last Year: Never true  Transportation Needs: No Transportation Needs (08/18/2023)   Received from [facility] - Transportation    In the past 12 months, has lack of transportation kept you from medical appointments or from getting medications?: No    Lack of Transportation (Non-Medical): No  Physical Activity: Not on file  Stress: Not on file  Social Connections: Not on file  Intimate Partner Violence: Not on file    Current Outpatient Medications on File Prior to Visit  Medication Sig Dispense Refill   levothyroxine  (SYNTHROID ) 75 MCG tablet Take 75 mcg by mouth daily before breakfast.     Accu-Chek FastClix Lancets MISC Apply 1 Lancet topically daily. (Patient not taking: Reported on 11/07/2023)     Cysteamine Bitartrate (PROCYSBI) 300 MG PACK  1 Lancet by Other route daily. (Patient not taking: Reported on 11/07/2023)     metroNIDAZOLE  (METROCREAM ) 0.75 % cream Apply topically daily as needed. (Patient not taking: Reported on 11/07/2023) 135 g 1   mometasone  (ELOCON ) 0.1 % ointment Apply topically as directed. Apply thin layer to affect area once daily up to 5 days per week. Please fill one at a time (Patient not taking: Reported on 11/07/2023) 15 g 1   PRECISION QID TEST test strip 1 each by Other route as needed. (Patient not taking: Reported on 11/07/2023)     Ruxolitinib Phosphate  (OPZELURA ) 1.5 % CREA Apply 1 Application topically daily. Apply daily to affected areas (Patient not taking: Reported on 11/07/2023) 180 g 1   No current facility-administered medications on file prior to visit.    Allergies  Allergen Reactions   Lidocaine Other (See Comments)    "fast heart beat, dizziness"     Review of Systems Constitutional: negative for chills, fatigue, fevers and sweats Eyes: negative for irritation, redness and visual disturbance Ears, nose, mouth, throat, and face: negative for hearing loss, nasal congestion, snoring and tinnitus Respiratory: negative for asthma, cough, sputum Cardiovascular: negative for chest pain, dyspnea, exertional chest pressure/discomfort, irregular heart beat, palpitations and syncope Gastrointestinal: negative for abdominal pain, change in bowel habits, nausea and vomiting Genitourinary: negative for abnormal menstrual periods, genital lesions, sexual problems and vaginal discharge, dysuria and urinary incontinence Integument/breast: negative for breast lump, breast tenderness and nipple discharge Hematologic/lymphatic: negative for bleeding and easy bruising Musculoskeletal:negative for back pain and muscle weakness Neurological: negative for dizziness, headaches, vertigo and weakness Endocrine: negative for diabetic symptoms including polydipsia, polyuria and skin dryness Allergic/Immunologic:  negative for hay fever and urticaria      Objective:  Blood pressure 124/85, pulse 97, height 5\' 3"  (1.6 m), weight 149 lb (67.6 kg), last menstrual period 10/28/2023. Body mass index is 26.39 kg/m.    General Appearance:    Alert, cooperative, no distress, appears stated age  Head:    Normocephalic, without obvious abnormality, atraumatic  Eyes:    PERRL, conjunctiva/corneas clear, EOM's intact, both eyes  Ears:    Normal external ear canals, both ears  Nose:   Nares normal, septum midline, mucosa normal, no drainage or sinus tenderness  Throat:   Lips, mucosa, and tongue normal; teeth and gums normal  Neck:   Supple, symmetrical, trachea midline, no adenopathy; thyroid : no enlargement/tenderness/nodules; no carotid bruit or JVD  Back:     Symmetric, no curvature, ROM normal, no CVA tenderness  Lungs:     Clear to auscultation bilaterally, respirations unlabored  Chest Wall:    No tenderness or  deformity   Heart:    Regular rate and rhythm, S1 and S2 normal, no murmur, rub or gallop  Breast Exam:    No tenderness, masses, or nipple abnormality  Abdomen:     Soft, non-tender, bowel sounds active all four quadrants, no masses, no organomegaly.    Genitalia:    Pelvic:external genitalia normal, vagina without lesions, discharge, or tenderness, rectovaginal septum  normal. Cervix normal in appearance, no cervical motion tenderness, no adnexal masses or tenderness.  Uterus normal size, shape, mobile, retroverted, regular contours, nontender.  Rectal:    Normal external sphincter.  No hemorrhoids appreciated. Internal exam not done.   Extremities:   Extremities normal, atraumatic, no cyanosis or edema  Pulses:   2+ and symmetric all extremities  Skin:   Skin color, texture, turgor normal, no rashes or lesions  Lymph nodes:   Cervical, supraclavicular, and axillary nodes normal  Neurologic:   CNII-XII intact, normal strength, sensation and reflexes throughout   .  Labs:  Lab Results   Component Value Date   WBC 6.6 07/03/2022   HGB 13.3 07/03/2022   HCT 41.1 07/03/2022   MCV 87.6 07/03/2022   PLT 213 07/03/2022    Lab Results  Component Value Date   CREATININE 0.70 07/03/2022   BUN 11 07/03/2022   NA 137 07/03/2022   K 3.4 (L) 07/03/2022   CL 105 07/03/2022   CO2 23 07/03/2022    Lab Results  Component Value Date   ALT 15 07/03/2022   AST 19 07/03/2022   ALKPHOS 44 07/03/2022   BILITOT 0.6 07/03/2022    Lab Results  Component Value Date   TSH 3.000 07/22/2020     Assessment:   1. Well woman exam with routine gynecological exam   2. Cervical cancer screening      Plan:   Breast self exam technique reviewed and patient encouraged to perform self-exam monthly. Contraception: condoms. Discussed healthy lifestyle modifications.  Pap smear up to date.  I have rec'd OTC supplements that may help her symptoms.  Follow up in 1 year for annual exam   Ana Balling, MD [facility]

## 2023-12-19 ENCOUNTER — Other Ambulatory Visit: Payer: Self-pay

## 2023-12-19 ENCOUNTER — Encounter: Payer: Self-pay | Admitting: Oncology

## 2023-12-19 ENCOUNTER — Ambulatory Visit (INDEPENDENT_AMBULATORY_CARE_PROVIDER_SITE_OTHER): Payer: Self-pay | Admitting: Oncology

## 2023-12-19 VITALS — BP 138/90 | HR 83 | Temp 98.5°F | Ht 63.0 in

## 2023-12-19 DIAGNOSIS — R21 Rash and other nonspecific skin eruption: Secondary | ICD-10-CM

## 2023-12-19 MED ORDER — GENTAMICIN SULFATE 0.3 % OP SOLN: 1.0000 [drp] | OPHTHALMIC | 0 refills | Status: AC

## 2023-12-19 MED ORDER — PREDNISONE 20 MG PO TABS: 40.0000 mg | ORAL_TABLET | Freq: Every day | ORAL | 0 refills | Status: DC

## 2023-12-19 NOTE — Progress Notes (Signed)
 [facility]  [address] [place], [place] [address] Phone: [phone] Fax: [phone]   Office Visit Note  Patient Name: Yesenia Moore  Date of Birth:[birth year]  Med Rec number [id number]  Date of Service: 12/19/2023  Lidocaine  Chief Complaint  Patient presents with   Rash    Patietn c/o a rash around her mouth and R eye. Her R eye also has a watery discharge and minor swelling beneath it. Symptoms began around 7-8 months ago but seemed to improve after seeing dermatology and using metronidazole  and mometasone  creams as prescribed. She states eye symptoms worsened yesterday after seeing an eye specialist for a regular eye again. Now she is concerned that the drops that were used to test her eyes may have caused an eye infection. The rash around her mouth has also returned.     HPI Patient is an 48 y.o. female who is here for concerns of a rash that she has had intermittently since Thanksgiving.  Reports it is mainly around her right eye but there is a small area above her left eyebrow.  She also has fairly significant rash around her mouth and under her nose.  States it is very itchy and has become painful.  No drainage that she is aware of.  Reports she has tried several creams.  Most recently she has been using a steroid cream but was prescribed metronidazole  cream which did not help much.  She was seen by dermatology who never gave her a clear diagnosis.  Reports it just appears to be getting worse.  She did have allergy  testing done around the same time and she had allergy  to nickel.  She has stopped wearing all jewelry and only uses a few skin care products without nickel.  She is also tried to limit her diet.  Symptoms had improved up until about 2 weeks ago.  She did go to a wedding and wore a little bit more make-up than usual.  She had an eye doctor's appointment within the past couple of weeks and thought it might have been something they had put in her eye.  Mild  drainage from her right eye this morning.  She has tried antihistamine drops with no improvement.  She has allergies to neomycin drops.  Current Medication:  Outpatient Encounter Medications as of 12/19/2023  Medication Sig   Accu-Chek FastClix Lancets MISC Apply 1 Lancet topically daily.   Cysteamine Bitartrate (PROCYSBI) 300 MG PACK 1 Lancet by Other route daily.   gentamicin  (GARAMYCIN ) 0.3 % ophthalmic solution Place 1 drop into both eyes every 4 (four) hours.   levothyroxine  (SYNTHROID ) 75 MCG tablet Take 75 mcg by mouth daily before breakfast.   metroNIDAZOLE  (METROCREAM ) 0.75 % cream Apply topically daily as needed.   mometasone  (ELOCON ) 0.1 % ointment Apply topically as directed. Apply thin layer to affect area once daily up to 5 days per week. Please fill one at a time   PRECISION QID TEST test strip 1 each by Other route as needed.   predniSONE (DELTASONE) 20 MG tablet Take 2 tablets (40 mg total) by mouth daily with breakfast.   Ruxolitinib Phosphate  (OPZELURA ) 1.5 % CREA Apply 1 Application topically daily. Apply daily to affected areas (Patient not taking: Reported on 11/07/2023)   No facility-administered encounter medications on file as of 12/19/2023.      Medical History: Past Medical History:  Diagnosis Date   Anemia    Diabetes mellitus without complication (HCC)    Gestational  diabetes   Hashimoto's thyroiditis    Thyroid  disease      Vital Signs: BP (!) 138/90   Pulse 83   Temp 98.5 F (36.9 C)   Ht 5' 3 (1.6 m)   SpO2 99%   BMI 26.39 kg/m   ROS: As per HPI.  All other pertinent ROS negative.     Review of Systems  Eyes:  Positive for pain, redness and itching.   Physical Exam  Skin:    Findings: Rash present. Rash is macular, papular and vesicular.     Comments: Periorbital right eye and left eye and around mouth.      No results found for this or any previous visit (from the past 24 hours).  Assessment/Plan: 1. Rash (Primary) Culture  today.  Will send off and send results via MyChart when available.  Recommend water and sensitive skin soap.  We discussed labs today to rule out tickborne illness or autoimmune disease.  Reports she has been bit by several ticks over the past few years.  We discussed going ahead and trying oral steroids 40 mg prednisone each 1 with breakfast x 4 days along with gentamicin  eyedrops given eye drainage.  May use saline drops in between gentamicin  for the next few days.  Recommend follow-up in 1 week. All - Anaerobic and Aerobic Culture - CBC with Differential/Platelet - Comprehensive metabolic panel with GFR - Tickborne Disease Antibody Profile, Serum - Lactate dehydrogenase - Antinuclear Antib (ANA) - Rheumatoid Factor    General Counseling: Yesenia Moore verbalizes understanding of the findings of todays visit and agrees with plan of treatment. I have discussed any further diagnostic evaluation that may be needed or ordered today. We also reviewed her medications today. she has been encouraged to call the office with any questions or concerns that should arise related to todays visit.   Orders Placed This Encounter  Procedures   Anaerobic and Aerobic Culture   CBC with Differential/Platelet   Comprehensive metabolic panel with GFR   Tickborne Disease Antibody Profile, Serum   Lactate dehydrogenase   Antinuclear Antib (ANA)   Rheumatoid Factor    Meds ordered this encounter  Medications   gentamicin  (GARAMYCIN ) 0.3 % ophthalmic solution    Sig: Place 1 drop into both eyes every 4 (four) hours.    Dispense:  5 mL    Refill:  0   predniSONE (DELTASONE) 20 MG tablet    Sig: Take 2 tablets (40 mg total) by mouth daily with breakfast.    Dispense:  8 tablet    Refill:  0    I spent 20 5 minutes dedicated to the care of this patient (face-to-face and non-face-to-face) on the date of the encounter to include what is described in the assessment and plan.   Delon Hope, NP 12/19/2023 3:06  PM

## 2023-12-21 ENCOUNTER — Ambulatory Visit: Payer: Self-pay | Admitting: Oncology

## 2023-12-21 ENCOUNTER — Ambulatory Visit: Admitting: Dermatology

## 2023-12-21 DIAGNOSIS — R21 Rash and other nonspecific skin eruption: Secondary | ICD-10-CM | POA: Diagnosis not present

## 2023-12-21 DIAGNOSIS — L03211 Cellulitis of face: Secondary | ICD-10-CM

## 2023-12-21 DIAGNOSIS — Z7189 Other specified counseling: Secondary | ICD-10-CM | POA: Diagnosis not present

## 2023-12-21 DIAGNOSIS — Z79899 Other long term (current) drug therapy: Secondary | ICD-10-CM

## 2023-12-21 MED ORDER — DOXYCYCLINE HYCLATE 50 MG PO CAPS: 50.0000 mg | ORAL_CAPSULE | Freq: Every day | ORAL | 0 refills | Status: DC

## 2023-12-21 MED ORDER — DOXYCYCLINE MONOHYDRATE 100 MG PO CAPS: ORAL_CAPSULE | ORAL | 0 refills | Status: DC

## 2023-12-21 MED ORDER — PIMECROLIMUS 1 % EX CREA: TOPICAL_CREAM | Freq: Two times a day (BID) | CUTANEOUS | 0 refills | Status: DC

## 2023-12-21 NOTE — Progress Notes (Signed)
   Follow Up Visit   Subjective  Yesenia Moore is a 48 y.o. female who presents for the following: Rash intermittent since Thanksgiving for which patient was using Mometasone  0.1% ointment prescribed by allergist, but recently flared on 12/15/23. Pt was seen by [facility] health care provider who prescribed Prednisone  20 mg 2 po QAM, and is still using Mometasone  0.1% ointment sparingly as needed. Pt concerned she may have an eye infection due to examination and using eyedrops.   Pt was seen by Dr. Maurilio and diagnosed with allergy  to tree pollen.  Pt diagnosed with gold, neomycin, and nickel allergy  via patch testing here in the office.  The following portions of the chart were reviewed this encounter and updated as appropriate: medications, allergies, medical history  Review of Systems:  No other skin or systemic complaints except as noted in HPI or Assessment and Plan.  Objective  Well appearing patient in no apparent distress; mood and affect are within normal limits.  A focused examination was performed of the following areas: The face  Relevant exam findings are noted in the Assessment and Plan.    Assessment & Plan   RASH   CELLULITIS OF FACE   COUNSELING AND COORDINATION OF CARE   MEDICATION MANAGEMENT           RASH - Perioral dermatitis vs Cellulitis   Exam: pink patches with scale and micropustules perioral and periocular areas  Chronic and persistent condition with duration over 6 mos. Condition is bothersome/symptomatic for patient. Currently flared.  Treatment Plan: D/C Prednisone  and Mometasone  0.1% ointment as they can worsen condition. Watch to see if certain toothpastes containing fluoride cause condition to flare.  Culture was done at [facility].  Will check results when ready.  Start Doxycycline  100 mg po BID x 7 days. Then decrease to Doxycycline  50 mg po QD. Doxycycline  should be taken with food to prevent nausea. Do not lay down for 30  minutes after taking. Be cautious with sun exposure and use good sun protection while on this medication. Pregnant women should not take this medication.   Start Elidel  cream to aa BID.   Recommend Neutrogena Water gel moisturizer and cleansers samples given of those and Cetaphil moisturizers and wash.   Do not start Metronidazole  1% gel at this time. Will re-evaluate at follow up.   Extensive counseling and discussion provided to patient today as well as second opinion by Dr. Jackquline.  Return in about 6 weeks (around 02/01/2024) for rash follow up.  LILLETTE Rosina Mayans, CMA, am acting as scribe for Alm Rhyme, MD .   Documentation: I have reviewed the above documentation for accuracy and completeness, and I agree with the above.  Alm Rhyme, MD

## 2023-12-21 NOTE — Patient Instructions (Addendum)
 Start Elidel  cream (pimecrolimus ) twice daily to affected areas of the face. This is not a topical steroid and is safe to use.  Start Doxycycline  100 mg take one capsule twice daily with food for 7 days.  After finishing 7 day course of Doxycycline  100mg , start Doxycycline  50 mg take one capsule daily with food.  Stop oral prednisone and topical Mometasone  0.1% ointment as these can cause condition to flare.  Do not start metronidazole  1% gel at this time.   Perioral dermatitis  Perioral dermatitis is an eruption which is usually located around the mouth and nose.  It can be a rash and/or red bumps.  It occasionally occurs around the eyes.  It may be itchy and may burn.  The exact cause is unknown.  Some types of makeup, moisturizers, dental products, and prescription creams may be partially responsible for the eruption.  Topical steroids such as cortisone creams can temporarily make the rash better but with discontinuation the rash tends to recur and worsen.  If you have been using topical steroids, your dermatologist may need to gradually taper the strength of steroids.  Topical antibiotics, elidel  cream, protopic ointment, and oral antiobiotics may be prescribed to treat this condition.  Although perioral dermatitis is not an infection, some antibiotics have anti-inflammatory properties that help it greatly.   Due to recent changes in healthcare laws, you may see results of your pathology and/or laboratory studies on MyChart before the doctors have had a chance to review them. We understand that in some cases there may be results that are confusing or concerning to you. Please understand that not all results are received at the same time and often the doctors may need to interpret multiple results in order to provide you with the best plan of care or course of treatment. Therefore, we ask that you please give us  2 business days to thoroughly review all your results before contacting the office for  clarification. Should we see a critical lab result, you will be contacted sooner.   If You Need Anything After Your Visit  If you have any questions or concerns for your doctor, please call our main line at [phone] and press option 4 to reach your doctor's medical assistant. If no one answers, please leave a voicemail as directed and we will return your call as soon as possible. Messages left after 4 pm will be answered the following business day.   You may also send us  a message via MyChart. We typically respond to MyChart messages within 1-2 business days.  For prescription refills, please ask your pharmacy to contact our office. Our fax number is [phone].  If you have an urgent issue when the clinic is closed that cannot wait until the next business day, you can page your doctor at the number below.    Please note that while we do our best to be available for urgent issues outside of office hours, we are not available 24/7.   If you have an urgent issue and are unable to reach us , you may choose to seek medical care at your doctor's office, retail clinic, urgent care center, or emergency room.  If you have a medical emergency, please immediately call 911 or go to the emergency department.  Pager Numbers  - Dr. Hester: [phone]  - Dr. Jackquline: [phone]  - Dr. Claudene: [phone]   In the event of inclement weather, please call our main line at [phone] for an update on the status of any  delays or closures.  Dermatology Medication Tips: Please keep the boxes that topical medications come in in order to help keep track of the instructions about where and how to use these. Pharmacies typically print the medication instructions only on the boxes and not directly on the medication tubes.   If your medication is too expensive, please contact our office at [phone] option 4 or send us  a message through MyChart.   We are unable to tell what your co-pay for  medications will be in advance as this is different depending on your insurance coverage. However, we may be able to find a substitute medication at lower cost or fill out paperwork to get insurance to cover a needed medication.   If a prior authorization is required to get your medication covered by your insurance company, please allow us  1-2 business days to complete this process.  Drug prices often vary depending on where the prescription is filled and some pharmacies may offer cheaper prices.  The website www.goodrx.com contains coupons for medications through different pharmacies. The prices here do not account for what the cost may be with help from insurance (it may be cheaper with your insurance), but the website can give you the price if you did not use any insurance.  - You can print the associated coupon and take it with your prescription to the pharmacy.  - You may also stop by our office during regular business hours and pick up a GoodRx coupon card.  - If you need your prescription sent electronically to a different pharmacy, notify our office through [facility] or by phone at [phone] option 4.     Si Usted Necesita Algo Despus de Su Visita  Tambin puede enviarnos un mensaje a travs de Clinical cytogeneticist. Por lo general respondemos a los mensajes de MyChart en el transcurso de 1 a 2 das hbiles.  Para renovar recetas, por favor pida a su farmacia que se ponga en contacto con nuestra oficina. Randi lakes de fax es [place] [phone].  Si tiene un asunto urgente cuando la clnica est cerrada y que no puede esperar hasta el siguiente da hbil, puede llamar/localizar a su doctor(a) al nmero que aparece a continuacin.   Por favor, tenga en cuenta que aunque hacemos todo lo posible para estar disponibles para asuntos urgentes fuera del horario de [place], no estamos disponibles las 24 horas del da, los 7 [address]  [address] de la [place].   Si tiene un problema urgente y no puede  comunicarse con nosotros, puede optar por buscar atencin mdica  en el consultorio de su doctor(a), en una clnica privada, en un centro de atencin urgente o en una sala de emergencias.  Si tiene Engineer, drilling, por favor llame inmediatamente al 911 o vaya a la sala de emergencias.  Nmeros de bper  - Dr. Hester: [phone]  - Dra. Jackquline: [phone]  - Dr. Claudene: [phone]   En caso de inclemencias del tiempo, por favor llame a landry capes principal al [phone] para una actualizacin sobre el [place] de cualquier retraso o cierre.  Consejos para la medicacin en dermatologa: Por favor, guarde las cajas en las que vienen los medicamentos de uso tpico para ayudarle a seguir las instrucciones sobre dnde y cmo usarlos. Las farmacias generalmente imprimen las instrucciones del medicamento slo en las cajas y no directamente en los tubos del [place].   Si su medicamento es muy caro, por favor, pngase en contacto con landry rieger llamando al [phone] y presione la  opcin 4 o envenos un mensaje a travs de MyChart.   No podemos decirle cul ser su copago por los medicamentos por adelantado ya que esto es diferente dependiendo de la cobertura de su seguro. Sin embargo, es posible que podamos encontrar un medicamento sustituto a Audiological scientist un formulario para que el seguro cubra el medicamento que se considera necesario.   Si se requiere una autorizacin previa para que su compaa de seguros malta su medicamento, por favor permtanos de 1 a 2 das hbiles para completar este proceso.  Los precios de los medicamentos varan con frecuencia dependiendo del Environmental consultant de dnde se surte la receta y alguna farmacias pueden ofrecer precios ms baratos.  El sitio web www.goodrx.com tiene cupones para medicamentos de Health and safety inspector. Los precios aqu no tienen en cuenta lo que podra costar con la ayuda del seguro (puede ser ms barato con su seguro), pero  el sitio web puede darle el precio si no utiliz Tourist information centre manager.  - Puede imprimir el cupn correspondiente y llevarlo con su receta a la farmacia.  - Tambin puede pasar por nuestra oficina durante el horario de atencin regular y Education officer, museum una tarjeta de cupones de GoodRx.  - Si necesita que su receta se enve electrnicamente a una farmacia diferente, informe a nuestra oficina a travs de MyChart de  o por telfono llamando al [phone] y presione la opcin 4.

## 2023-12-25 LAB — ANAEROBIC AND AEROBIC CULTURE

## 2023-12-26 ENCOUNTER — Encounter: Payer: Self-pay | Admitting: Dermatology

## 2023-12-26 ENCOUNTER — Telehealth: Payer: Self-pay

## 2023-12-26 ENCOUNTER — Ambulatory Visit (INDEPENDENT_AMBULATORY_CARE_PROVIDER_SITE_OTHER): Payer: Self-pay | Admitting: Medical

## 2023-12-26 VITALS — BP 130/98 | HR 110 | Temp 98.0°F | Ht 63.0 in

## 2023-12-26 DIAGNOSIS — L710 Perioral dermatitis: Secondary | ICD-10-CM

## 2023-12-26 MED ORDER — MUPIROCIN 2 % EX OINT: 1.0000 | TOPICAL_OINTMENT | Freq: Two times a day (BID) | CUTANEOUS | 0 refills | Status: AC

## 2023-12-26 MED ORDER — CEPHALEXIN 500 MG PO CAPS: 500.0000 mg | ORAL_CAPSULE | Freq: Two times a day (BID) | ORAL | 0 refills | Status: DC

## 2023-12-26 NOTE — Telephone Encounter (Addendum)
 Patient has called back to the office multiple times this afternoon demanding to be taken care of today. Patient advised Dr. Hester was not physically in the office this morning when she stopped by and he has been in clinic this afternoon. Also advised patient our office memo states we have 48 business hours to return a patient's call. Advised patient I would get to her as soon as Dr. Hester responded and she hung up the phone on me.

## 2023-12-26 NOTE — Progress Notes (Addendum)
 [facility] [address] [place], [place] [address]   Office Visit Note  Patient Name: Yesenia Moore Date of Birth [id number]  Medical Record number [id number]  Date of Service: 12/26/2023  Chief Complaint  Patient presents with   Follow-up    Patient saw dermatologist on 12/21/23. Patient was advised to D/C prednisone . She states the rash is very itchy. Culture was positive for staph infection.     HPI Pt is a 48 YO female here for a follow up on facial rash.  Pt was seen at our clinic 12/19/23 for facial rash and eye sx, started on oral Prednisone  and Gentamicin  eye drops. Had labs skin culture. Saw dermatology on 7/2. Prednisone  was stopped. Was started on Doxycycline  and Elidel  cream.   Labs were normal, though skin culture returned showing staph bacteria infection resistant to Tetracycline.  Developed itching and redness to arms when taking Doxycycline , which she took for about 4 days. Denies spending extended period in sun while on Doxycycline . Last dose was about 24 hours ago. Elected to stop due to itching. Denies shortness of breath or swelling to tongue/lips/throat.  Also felt more itching and burning to face around eyes. Eyes have continued to be watery. Also stopped Elidel , felt it was getting in her eyes and irritating them. Felt eyes were more swollen after using this.   No fever or chills.   Current Medication:  Outpatient Encounter Medications as of 12/26/2023  Medication Sig   Accu-Chek FastClix Lancets MISC Apply 1 Lancet topically daily.   Cysteamine Bitartrate (PROCYSBI) 300 MG PACK 1 Lancet by Other route daily.   doxycycline  (MONODOX ) 100 MG capsule Take one tab po BID x 7 days. Take with food and plenty of drink.   doxycycline  (VIBRAMYCIN ) 50 MG capsule Take 1 capsule (50 mg total) by mouth daily. After finishing Doxycycline  100 mg po BID x 7 days. Start 50 mg po QD. Take with food and plenty of drink.   gentamicin  (GARAMYCIN ) 0.3 % ophthalmic solution  Place 1 drop into both eyes every 4 (four) hours.   levothyroxine  (SYNTHROID ) 75 MCG tablet Take 75 mcg by mouth daily before breakfast.   metroNIDAZOLE  (METROCREAM ) 0.75 % cream Apply topically daily as needed.   PRECISION QID TEST test strip 1 each by Other route as needed.   Ruxolitinib Phosphate  (OPZELURA ) 1.5 % CREA Apply 1 Application topically daily. Apply daily to affected areas   mometasone  (ELOCON ) 0.1 % ointment Apply topically as directed. Apply thin layer to affect area once daily up to 5 days per week. Please fill one at a time (Patient not taking: Reported on 12/26/2023)   pimecrolimus  (ELIDEL ) 1 % cream Apply topically 2 (two) times daily. Apply to rash on the face twice daily until clear. (Patient not taking: Reported on 12/26/2023)   [DISCONTINUED] predniSONE  (DELTASONE ) 20 MG tablet Take 2 tablets (40 mg total) by mouth daily with breakfast.   No facility-administered encounter medications on file as of 12/26/2023.      Medical History: Past Medical History:  Diagnosis Date   Anemia    Diabetes mellitus without complication (HCC)    Gestational diabetes   Hashimoto's thyroiditis    Thyroid  disease      Vital Signs: BP (!) 130/98   Pulse (!) 110   Temp 98 F (36.7 C)   Ht 5' 3 (1.6 m)   SpO2 98%   BMI 26.39 kg/m    Review of Systems  Constitutional:  Negative for chills and  fever.  Skin:  Positive for rash.       itching    Physical Exam Vitals reviewed.  Constitutional:      General: She is not in acute distress.    Appearance: She is not ill-appearing.  Skin:    Findings: Rash (Maculopapular mildly erythematous facial rash, concentrated around mouth, nose and eyes.) present.  Neurological:     Mental Status: She is alert.    Recent Results (from the past 2160 hours)  Anaerobic and Aerobic Culture     Status: Abnormal   Collection Time: 12/19/23  2:50 PM   Specimen: Blood   BLD  Result Value Ref Range   Anaerobic Culture Final report    Result  1 Comment     Comment: No anaerobic growth in 72 hours.   Aerobic Culture Final report (A)    Result 1 Staphylococcus aureus (A)     Comment: Based on susceptibility to oxacillin this isolate would be susceptible to: *Penicillinase-stable penicillins, such as:   Cloxacillin, Dicloxacillin, Nafcillin *Beta-lactam combination agents, such as:   Amoxicillin -clavulanic acid, Ampicillin-sulbactam,   Piperacillin-tazobactam *Oral cephems, such as:   Cefaclor, Cefdinir, Cefpodoxime, Cefprozil, Cefuroxime,   Cephalexin , Loracarbef *Parenteral cephems, such as:   Cefazolin , Cefepime, Cefotaxime, Cefotetan, Ceftaroline,   Ceftizoxime, Ceftriaxone, Cefuroxime *Carbapenems, such as:   Doripenem, Ertapenem, Imipenem, Meropenem Scant growth    Result 2 Comment     Comment: Mixed skin flora including multiple gram negative rods. Light growth    Antimicrobial Susceptibility Comment     Comment:       ** S = Susceptible; I = Intermediate; R = Resistant **                    P = Positive; N = Negative             MICS are expressed in micrograms per mL    Antibiotic                 RSLT#1    RSLT#2    RSLT#3    RSLT#4 Ciprofloxacin                   S Clindamycin                    S Erythromycin                   S Gentamicin                      S Levofloxacin                   S Linezolid                      S Moxifloxacin                   S Oxacillin                      S Penicillin                     R Rifampin                       S Tetracycline                   R Trimethoprim/Sulfa  S Vancomycin                     S   CBC with Differential/Platelet     Status: None   Collection Time: 12/19/23  2:50 PM  Result Value Ref Range   WBC 7.1 3.4 - 10.8 x10E3/uL   RBC 4.74 3.77 - 5.28 x10E6/uL   Hemoglobin 14.0 11.1 - 15.9 g/dL   Hematocrit 56.0 65.9 - 46.6 %   MCV 93 79 - 97 fL   MCH 29.5 26.6 - 33.0 pg   MCHC 31.9 31.5 - 35.7 g/dL   RDW 87.3 88.2 - 84.5 %    Platelets 212 150 - 450 x10E3/uL   Neutrophils 68 Not Estab. %   Lymphs 19 Not Estab. %   Monocytes 11 Not Estab. %   Eos 1 Not Estab. %   Basos 1 Not Estab. %   Neutrophils Absolute 4.8 1.4 - 7.0 x10E3/uL   Lymphocytes Absolute 1.4 0.7 - 3.1 x10E3/uL   Monocytes Absolute 0.8 0.1 - 0.9 x10E3/uL   EOS (ABSOLUTE) 0.1 0.0 - 0.4 x10E3/uL   Basophils Absolute 0.1 0.0 - 0.2 x10E3/uL   Immature Granulocytes 0 Not Estab. %   Immature Grans (Abs) 0.0 0.0 - 0.1 x10E3/uL  Comprehensive metabolic panel with GFR     Status: None   Collection Time: 12/19/23  2:50 PM  Result Value Ref Range   Glucose 87 70 - 99 mg/dL   BUN 12 6 - 24 mg/dL   Creatinine, Ser 9.34 0.57 - 1.00 mg/dL   eGFR 888 >40 fO/fpw/8.26   BUN/Creatinine Ratio 18 9 - 23   Sodium 138 134 - 144 mmol/L   Potassium 3.9 3.5 - 5.2 mmol/L   Chloride 102 96 - 106 mmol/L   CO2 20 20 - 29 mmol/L   Calcium 9.3 8.7 - 10.2 mg/dL   Total Protein 6.6 6.0 - 8.5 g/dL   Albumin 4.4 3.9 - 4.9 g/dL   Globulin, Total 2.2 1.5 - 4.5 g/dL   Bilirubin Total 0.3 0.0 - 1.2 mg/dL   Alkaline Phosphatase 60 44 - 121 IU/L   AST 18 0 - 40 IU/L   ALT 14 0 - 32 IU/L  Tickborne Disease Antibody Profile, Serum     Status: None   Collection Time: 12/19/23  2:50 PM  Result Value Ref Range   Lyme Total Antibody EIA Negative Negative    Comment: Lyme antibodies not detected. Reflex testing is not indicated. No laboratory evidence of infection with B. burgdorferi (Lyme disease). Negative results may occur in patients recently infected (less than or equal to 14 days) with B. burgdorferi.  If recent infection is suspected, repeat testing on a new sample collected in 7 to 14 days is recommended.    Babesia microti IgG <1:10 Neg:<1:10   E.Chaffeensis (HME) IgG Negative Neg:<1:64   HGE IgG Titer Negative Neg:<1:64   Result Comments: Comment     Comment: Antibody titers may be negative in the first 7-10 days of illness. A four-fold rise in IgG antibody titers  for Babesia microti, Anaplasma phagocytophilum, and/or Ehrlichia chaffeensis in paired samples (acute and convalescent) supports the diagnosis of babesiosis, anaplasmosis, and/or ehrlichiosis, respectively.   Lactate dehydrogenase     Status: None   Collection Time: 12/19/23  2:50 PM  Result Value Ref Range   LDH 196 119 - 226 IU/L  Antinuclear Antib (ANA)     Status: None   Collection Time: 12/19/23  2:50 PM  Result Value Ref Range   Anti Nuclear Antibody (ANA) Negative Negative  Rheumatoid Factor     Status: None   Collection Time: 12/19/23  2:50 PM  Result Value Ref Range   Rheumatoid fact SerPl-aCnc <10.0 <14.0 IU/mL     Assessment/Plan: 1. Perioral dermatitis (Primary) Rash appears suggestive of perioral dermatitis though cx was positive for Staph. Possible allergy  to Doxycycline , patient does not wish to continue this. Cx also shows staph resistant to Tetracycline. Rash also may be flaring slightly with discontinuation of steroid. Reviewed lab results with patient.  Different oral antibiotic likely reasonable but explained to patient that because she is currently under care of dermatologist, would recommend seeking their advice/opinion. Encouraged patient to call dermatology to update them/seek guidance.  Encouraged patient to continue using Elidel  around mouth and to lower eyelids if possible. Advised no makeup or other products not recommended by dermatologist.    General Counseling: Forrestine oakland understanding of the findings of todays visit. she has been encouraged to call the office with any questions or concerns that should arise related to todays visit.   Time spent:20 Minutes    Joen Arts PA-C Physician Assistant

## 2023-12-26 NOTE — Addendum Note (Signed)
 Addended by: TERESA PALMA R on: 12/26/2023 04:30 PM   Modules accepted: Orders

## 2023-12-26 NOTE — Telephone Encounter (Signed)
 Patient and spouse came into the office this morning regarding no improvement since last week. She states her culture from [facility] PCP came back resistant to tetracycline so she stopped Doxycycline  after 4 days. Patient advised Dr. Hester not in office this morning. Patient did not want to leave the office without being seen. Advised patient I would discuss with Dr. Hester and call her back once he responds.

## 2023-12-26 NOTE — Telephone Encounter (Signed)
 Patient advised of all information per Dr. Hester. Patient asked multiple times how she got this and advised her we can not pin point exactly where this came from. Patient also questioned if she needed both medications suggested by Dr. Hester multiple times - I have advised patient it is necessary that take take oral and use topical antibiotic. aw

## 2023-12-27 ENCOUNTER — Encounter: Payer: Self-pay | Admitting: Dermatology

## 2023-12-27 LAB — CBC WITH DIFFERENTIAL/PLATELET
Basophils Absolute: 0.1 x10E3/uL (ref 0.0–0.2)
Basos: 1 %
EOS (ABSOLUTE): 0.1 x10E3/uL (ref 0.0–0.4)
Eos: 1 %
Hematocrit: 43.9 % (ref 34.0–46.6)
Hemoglobin: 14 g/dL (ref 11.1–15.9)
Immature Grans (Abs): 0 x10E3/uL (ref 0.0–0.1)
Immature Granulocytes: 0 %
Lymphocytes Absolute: 1.4 x10E3/uL (ref 0.7–3.1)
Lymphs: 19 %
MCH: 29.5 pg (ref 26.6–33.0)
MCHC: 31.9 g/dL (ref 31.5–35.7)
MCV: 93 fL (ref 79–97)
Monocytes Absolute: 0.8 x10E3/uL (ref 0.1–0.9)
Monocytes: 11 %
Neutrophils Absolute: 4.8 x10E3/uL (ref 1.4–7.0)
Neutrophils: 68 %
Platelets: 212 x10E3/uL (ref 150–450)
RBC: 4.74 x10E6/uL (ref 3.77–5.28)
RDW: 12.6 % (ref 11.7–15.4)
WBC: 7.1 x10E3/uL (ref 3.4–10.8)

## 2023-12-27 LAB — COMPREHENSIVE METABOLIC PANEL WITH GFR
ALT: 14 IU/L (ref 0–32)
AST: 18 IU/L (ref 0–40)
Albumin: 4.4 g/dL (ref 3.9–4.9)
Alkaline Phosphatase: 60 IU/L (ref 44–121)
BUN/Creatinine Ratio: 18 (ref 9–23)
BUN: 12 mg/dL (ref 6–24)
Bilirubin Total: 0.3 mg/dL (ref 0.0–1.2)
CO2: 20 mmol/L (ref 20–29)
Calcium: 9.3 mg/dL (ref 8.7–10.2)
Chloride: 102 mmol/L (ref 96–106)
Creatinine, Ser: 0.65 mg/dL (ref 0.57–1.00)
Globulin, Total: 2.2 g/dL (ref 1.5–4.5)
Glucose: 87 mg/dL (ref 70–99)
Potassium: 3.9 mmol/L (ref 3.5–5.2)
Sodium: 138 mmol/L (ref 134–144)
Total Protein: 6.6 g/dL (ref 6.0–8.5)
eGFR: 111 mL/min/1.73 (ref >59)

## 2023-12-27 LAB — LACTATE DEHYDROGENASE: LDH: 196 IU/L (ref 119–226)

## 2023-12-27 LAB — TICKBORNE DISEASE ANTIBODY PROFILE, SERUM
Babesia microti IgG: <1:10 {titer}
E.Chaffeensis (HME) IgG: NEGATIVE
HGE IgG Titer: NEGATIVE
Lyme Total Antibody EIA: NEGATIVE

## 2023-12-27 LAB — ANA: Anti Nuclear Antibody (ANA): NEGATIVE

## 2023-12-27 LAB — RHEUMATOID FACTOR: Rheumatoid fact SerPl-aCnc: <10 [IU]/mL (ref <14.0)

## 2023-12-28 ENCOUNTER — Ambulatory Visit (INDEPENDENT_AMBULATORY_CARE_PROVIDER_SITE_OTHER): Payer: Self-pay | Admitting: Oncology

## 2023-12-28 ENCOUNTER — Telehealth: Payer: Self-pay

## 2023-12-28 DIAGNOSIS — L710 Perioral dermatitis: Secondary | ICD-10-CM

## 2023-12-28 NOTE — Telephone Encounter (Signed)
 Tried calling patient regarding concerns she had sent to us  through [place] encounter. Patient did not answer. Also have responded to patient through mychart letting her know she can call us  back about concerns. Otherwise she would need to schedule an appointment to discuss information with Dr. Hester if she was wanting to talk to him directly.    Please refer to patient message dated 12/26/2023.

## 2023-12-28 NOTE — Progress Notes (Signed)
 [facility]  [address] [place], [place] [address] Phone: [phone] Fax: [phone]   Office Visit Note  Patient Name: Yesenia Moore  Date of Birth:1975/11/02  Med Rec number [id number]  Date of Service: 12/28/2023  Lidocaine  No chief complaint on file.  HPI Patient is an 48 y.o. who is here virtually to discuss her facial rash.  She was started on Keflex  yesterday by dermatology after skin culture returned showing staph infection.  During her initial visit on 7-25 she was started on doxycycline  but staph culture revealed it was resistant so she was switched to Keflex .  She was also prescribed mupirocin  cream.  She feels like the mupirocin  cream is making her symptoms worse.  This morning was her fourth dose of Keflex .  Current Medication:  Outpatient Encounter Medications as of 12/28/2023  Medication Sig   Accu-Chek FastClix Lancets MISC Apply 1 Lancet topically daily.   cephALEXin  (KEFLEX ) 500 MG capsule Take 1 capsule (500 mg total) by mouth 2 (two) times daily for 10 days.   Cysteamine Bitartrate (PROCYSBI) 300 MG PACK 1 Lancet by Other route daily.   doxycycline  (MONODOX ) 100 MG capsule Take one tab po BID x 7 days. Take with food and plenty of drink.   doxycycline  (VIBRAMYCIN ) 50 MG capsule Take 1 capsule (50 mg total) by mouth daily. After finishing Doxycycline  100 mg po BID x 7 days. Start 50 mg po QD. Take with food and plenty of drink.   gentamicin  (GARAMYCIN ) 0.3 % ophthalmic solution Place 1 drop into both eyes every 4 (four) hours.   levothyroxine  (SYNTHROID ) 75 MCG tablet Take 75 mcg by mouth daily before breakfast.   metroNIDAZOLE  (METROCREAM ) 0.75 % cream Apply topically daily as needed.   mometasone  (ELOCON ) 0.1 % ointment Apply topically as directed. Apply thin layer to affect area once daily up to 5 days per week. Please fill one at a time (Patient not taking: Reported on 12/26/2023)   mupirocin  ointment (BACTROBAN ) 2 % Apply 1 Application topically 2  (two) times daily. To any affected areas   pimecrolimus  (ELIDEL ) 1 % cream Apply topically 2 (two) times daily. Apply to rash on the face twice daily until clear. (Patient not taking: Reported on 12/26/2023)   PRECISION QID TEST test strip 1 each by Other route as needed.   Ruxolitinib Phosphate  (OPZELURA ) 1.5 % CREA Apply 1 Application topically daily. Apply daily to affected areas   No facility-administered encounter medications on file as of 12/28/2023.      Medical History: Past Medical History:  Diagnosis Date   Anemia    Diabetes mellitus without complication (HCC)    Gestational diabetes   Hashimoto's thyroiditis    Thyroid  disease      Vital Signs: There were no vitals taken for this visit.  ROS: As per HPI.  All other pertinent ROS negative.     Review of Systems  Skin:  Positive for rash.    Physical Exam Skin:    Findings: Rash present.     Comments: Maculopapular rash around eyes, mouth and nose.       No results found for this or any previous visit (from the past 24 hours).  Assessment/Plan: 1. Perioral dermatitis (Primary) Patient is followed by dermatology.  Skin culture did show staph.  We discussed continuing Keflex  which they prescribed for her along with mupirocin  cream.  Based off of photos from Dr. Delphia office and the ones that she sent via MyChart, rash appears to  be improving.  I encouraged her to continue and to reach back out to dermatology should symptoms worsen.  General Counseling: Teaira verbalizes understanding of the findings of todays visit and agrees with plan of treatment. I have discussed any further diagnostic evaluation that may be needed or ordered today. We also reviewed her medications today. she has been encouraged to call the office with any questions or concerns that should arise related to todays visit.   No orders of the defined types were placed in this encounter.   No orders of the defined types were placed in this  encounter.   I spent 20 minutes dedicated to the care of this patient (face-to-face and non-face-to-face) on the date of the encounter to include what is described in the assessment and plan.   Delon Hope, NP 12/28/2023 2:16 PM

## 2024-01-01 NOTE — Progress Notes (Incomplete)
 [facility] [address] [place], [place] [address]   Office Visit Note  Patient Name: Yesenia Moore Date of Birth [id number]  Medical Record number [id number]  Date of Service: 12/26/2023  Chief Complaint  Patient presents with  . Follow-up    Patient saw dermatologist on 12/21/23. Patient was advised to D/C prednisone . She states the rash is very itchy. Culture was positive for staph infection.     HPI Pt is a 48 YO female here for a follow up on facial rash.  Pt was seen at our clinic 12/19/23 for facial rash and eye sx, started on oral Prednisone  and Gentamicin  eye drops. Had labs skin culture. Saw dermatology on 7/2. Prednisone  was stopped. Was started on Doxycycline  and Elidel  cream.   Labs were normal, though skin culture returned showing staph bacteria infection resistant to Tetracycline.  Developed itching and redness to arms when taking Doxycycline , which she took for about 4 days. Denies spending extended period in sun while on Doxycycline . Last dose was about 24 hours ago. Elected to stop due to itching. Denies shortness of breath or swelling to tongue/lips/throat.  Also felt more itching and burning to face around eyes. Eyes have continued to be watery. Also stopped Elidel , felt it was getting in her eyes and irritating them. Felt eyes were more swollen after using this.   No fever or chills.   Current Medication:  Outpatient Encounter Medications as of 12/26/2023  Medication Sig  . Accu-Chek FastClix Lancets MISC Apply 1 Lancet topically daily.  . Cysteamine Bitartrate (PROCYSBI) 300 MG PACK 1 Lancet by Other route daily.  . doxycycline  (MONODOX ) 100 MG capsule Take one tab po BID x 7 days. Take with food and plenty of drink.  . doxycycline  (VIBRAMYCIN ) 50 MG capsule Take 1 capsule (50 mg total) by mouth daily. After finishing Doxycycline  100 mg po BID x 7 days. Start 50 mg po QD. Take with food and plenty of drink.  . gentamicin  (GARAMYCIN ) 0.3 % ophthalmic  solution Place 1 drop into both eyes every 4 (four) hours.  . levothyroxine  (SYNTHROID ) 75 MCG tablet Take 75 mcg by mouth daily before breakfast.  . metroNIDAZOLE  (METROCREAM ) 0.75 % cream Apply topically daily as needed.  SABRA PRECISION QID TEST test strip 1 each by Other route as needed.  . Ruxolitinib Phosphate  (OPZELURA ) 1.5 % CREA Apply 1 Application topically daily. Apply daily to affected areas  . mometasone  (ELOCON ) 0.1 % ointment Apply topically as directed. Apply thin layer to affect area once daily up to 5 days per week. Please fill one at a time (Patient not taking: Reported on 12/26/2023)  . pimecrolimus  (ELIDEL ) 1 % cream Apply topically 2 (two) times daily. Apply to rash on the face twice daily until clear. (Patient not taking: Reported on 12/26/2023)  . [DISCONTINUED] predniSONE  (DELTASONE ) 20 MG tablet Take 2 tablets (40 mg total) by mouth daily with breakfast.   No facility-administered encounter medications on file as of 12/26/2023.      Medical History: Past Medical History:  Diagnosis Date  . Anemia   . Diabetes mellitus without complication (HCC)    Gestational diabetes  . Hashimoto's thyroiditis   . Thyroid  disease      Vital Signs: BP (!) 130/98   Pulse (!) 110   Temp 98 F (36.7 C)   Ht 5' 3 (1.6 m)   SpO2 98%   BMI 26.39 kg/m    Review of Systems  Constitutional:  Negative for chills and  fever.  Skin:  Positive for rash.       itching    Physical Exam Vitals reviewed.  Constitutional:      General: She is not in acute distress.    Appearance: She is not ill-appearing.  Skin:    Findings: Rash (Maculopapular mildly erythematous facial rash, concentrated around mouth, nose and eyes.) present.  Neurological:     Mental Status: She is alert.    Recent Results (from the past 2160 hours)  Anaerobic and Aerobic Culture     Status: Abnormal   Collection Time: 12/19/23  2:50 PM   Specimen: Blood   BLD  Result Value Ref Range   Anaerobic Culture  Final report    Result 1 Comment     Comment: No anaerobic growth in 72 hours.   Aerobic Culture Final report (A)    Result 1 Staphylococcus aureus (A)     Comment: Based on susceptibility to oxacillin this isolate would be susceptible to: *Penicillinase-stable penicillins, such as:   Cloxacillin, Dicloxacillin, Nafcillin *Beta-lactam combination agents, such as:   Amoxicillin -clavulanic acid, Ampicillin-sulbactam,   Piperacillin-tazobactam *Oral cephems, such as:   Cefaclor, Cefdinir, Cefpodoxime, Cefprozil, Cefuroxime,   Cephalexin , Loracarbef *Parenteral cephems, such as:   Cefazolin , Cefepime, Cefotaxime, Cefotetan, Ceftaroline,   Ceftizoxime, Ceftriaxone, Cefuroxime *Carbapenems, such as:   Doripenem, Ertapenem, Imipenem, Meropenem Scant growth    Result 2 Comment     Comment: Mixed skin flora including multiple gram negative rods. Light growth    Antimicrobial Susceptibility Comment     Comment:       ** S = Susceptible; I = Intermediate; R = Resistant **                    P = Positive; N = Negative             MICS are expressed in micrograms per mL    Antibiotic                 RSLT#1    RSLT#2    RSLT#3    RSLT#4 Ciprofloxacin                   S Clindamycin                    S Erythromycin                   S Gentamicin                      S Levofloxacin                   S Linezolid                      S Moxifloxacin                   S Oxacillin                      S Penicillin                     R Rifampin                       S Tetracycline                   R Trimethoprim/Sulfa  S Vancomycin                     S   CBC with Differential/Platelet     Status: None   Collection Time: 12/19/23  2:50 PM  Result Value Ref Range   WBC 7.1 3.4 - 10.8 x10E3/uL   RBC 4.74 3.77 - 5.28 x10E6/uL   Hemoglobin 14.0 11.1 - 15.9 g/dL   Hematocrit 56.0 65.9 - 46.6 %   MCV 93 79 - 97 fL   MCH 29.5 26.6 - 33.0 pg   MCHC 31.9 31.5 - 35.7 g/dL   RDW  87.3 88.2 - 84.5 %   Platelets 212 150 - 450 x10E3/uL   Neutrophils 68 Not Estab. %   Lymphs 19 Not Estab. %   Monocytes 11 Not Estab. %   Eos 1 Not Estab. %   Basos 1 Not Estab. %   Neutrophils Absolute 4.8 1.4 - 7.0 x10E3/uL   Lymphocytes Absolute 1.4 0.7 - 3.1 x10E3/uL   Monocytes Absolute 0.8 0.1 - 0.9 x10E3/uL   EOS (ABSOLUTE) 0.1 0.0 - 0.4 x10E3/uL   Basophils Absolute 0.1 0.0 - 0.2 x10E3/uL   Immature Granulocytes 0 Not Estab. %   Immature Grans (Abs) 0.0 0.0 - 0.1 x10E3/uL  Comprehensive metabolic panel with GFR     Status: None   Collection Time: 12/19/23  2:50 PM  Result Value Ref Range   Glucose 87 70 - 99 mg/dL   BUN 12 6 - 24 mg/dL   Creatinine, Ser 9.34 0.57 - 1.00 mg/dL   eGFR 888 >40 fO/fpw/8.26   BUN/Creatinine Ratio 18 9 - 23   Sodium 138 134 - 144 mmol/L   Potassium 3.9 3.5 - 5.2 mmol/L   Chloride 102 96 - 106 mmol/L   CO2 20 20 - 29 mmol/L   Calcium 9.3 8.7 - 10.2 mg/dL   Total Protein 6.6 6.0 - 8.5 g/dL   Albumin 4.4 3.9 - 4.9 g/dL   Globulin, Total 2.2 1.5 - 4.5 g/dL   Bilirubin Total 0.3 0.0 - 1.2 mg/dL   Alkaline Phosphatase 60 44 - 121 IU/L   AST 18 0 - 40 IU/L   ALT 14 0 - 32 IU/L  Tickborne Disease Antibody Profile, Serum     Status: None   Collection Time: 12/19/23  2:50 PM  Result Value Ref Range   Lyme Total Antibody EIA Negative Negative    Comment: Lyme antibodies not detected. Reflex testing is not indicated. No laboratory evidence of infection with B. burgdorferi (Lyme disease). Negative results may occur in patients recently infected (less than or equal to 14 days) with B. burgdorferi.  If recent infection is suspected, repeat testing on a new sample collected in 7 to 14 days is recommended.    Babesia microti IgG <1:10 Neg:<1:10   E.Chaffeensis (HME) IgG Negative Neg:<1:64   HGE IgG Titer Negative Neg:<1:64   Result Comments: Comment     Comment: Antibody titers may be negative in the first 7-10 days of illness. A four-fold rise in  IgG antibody titers for Babesia microti, Anaplasma phagocytophilum, and/or Ehrlichia chaffeensis in paired samples (acute and convalescent) supports the diagnosis of babesiosis, anaplasmosis, and/or ehrlichiosis, respectively.   Lactate dehydrogenase     Status: None   Collection Time: 12/19/23  2:50 PM  Result Value Ref Range   LDH 196 119 - 226 IU/L  Antinuclear Antib (ANA)     Status: None   Collection Time: 12/19/23  2:50 PM  Result Value Ref Range   Anti Nuclear Antibody (ANA) Negative Negative  Rheumatoid Factor     Status: None   Collection Time: 12/19/23  2:50 PM  Result Value Ref Range   Rheumatoid fact SerPl-aCnc <10.0 <14.0 IU/mL     Assessment/Plan: 1. Perioral dermatitis (Primary) Rash appears consistent with perioral dermatitis. ?allergy  to Doxycycline , patient does not wish to continue this. Cx also shows staph resistant to Tetracycline. Wonder if oral Erythromycin may be reasonable alternative, will reach out to dermatology through Epic, also encouraged patient to call dermatology to update them/seek guidance.  Encouraged patient to continue using Elidel  around mouth and below eyes.     General Counseling: Ryin verbalizes understanding of the findings of todays visit and agrees with plan of treatment. I have discussed any further diagnostic evaluation that may be needed or ordered today. We also reviewed her medications today. she has been encouraged to call the office with any questions or concerns that should arise related to todays visit.   No orders of the defined types were placed in this encounter.   No orders of the defined types were placed in this encounter.   Time spent:*** Minutes    Juliene DOROTHA Howells AGNP-C Nurse Practitioner

## 2024-01-02 NOTE — Patient Instructions (Addendum)
-  Call your dermatologist to discuss the reaction you had after taking Doxycycline . Ask if there is a different medicine you can take instead. -I recommend using the Elidel  (Pimecrolimus ) cream that the dermatologist prescribed around your mouth/nose and under your eyes.

## 2024-01-05 ENCOUNTER — Other Ambulatory Visit: Payer: Self-pay

## 2024-01-05 MED ORDER — PIMECROLIMUS 1 % EX CREA: TOPICAL_CREAM | Freq: Two times a day (BID) | CUTANEOUS | 0 refills | Status: AC

## 2024-01-05 MED ORDER — CEPHALEXIN 500 MG PO CAPS: ORAL_CAPSULE | ORAL | 0 refills | Status: DC

## 2024-01-05 NOTE — Addendum Note (Signed)
 Addended by: TERESA PALMA R on: 01/05/2024 09:09 AM   Modules accepted: Orders

## 2024-01-05 NOTE — Progress Notes (Signed)
 Per verbal order of Dr. Hester send in one more week of Cephalexin  500 mg po BID x 7 days.

## 2024-01-05 NOTE — Progress Notes (Signed)
 New prescription sent in of Elidel  Cream. Patient does not want tube purchased a few weeks ago as she feels it is contaminated.  Patient states she used the cream once and didn't like it so she stopped.  Advised patient she is not using topicals as recommended per Dr. Hester so she is risking not improving.   Patient also keeps talking about having an infections but I advised her we have used a topical and oral antibiotic for the last week and now we are trying to target and treat her inflammation since she is complaining of swelling.   Patient advised to call next week if no improvement.

## 2024-01-11 ENCOUNTER — Encounter: Payer: Self-pay | Admitting: Dermatology

## 2024-01-11 ENCOUNTER — Ambulatory Visit: Admitting: Dermatology

## 2024-01-11 DIAGNOSIS — L719 Rosacea, unspecified: Secondary | ICD-10-CM | POA: Diagnosis not present

## 2024-01-11 DIAGNOSIS — L710 Perioral dermatitis: Secondary | ICD-10-CM

## 2024-01-11 DIAGNOSIS — H01119 Allergic dermatitis of unspecified eye, unspecified eyelid: Secondary | ICD-10-CM

## 2024-01-11 DIAGNOSIS — R21 Rash and other nonspecific skin eruption: Secondary | ICD-10-CM

## 2024-01-11 DIAGNOSIS — Z79899 Other long term (current) drug therapy: Secondary | ICD-10-CM

## 2024-01-11 DIAGNOSIS — Z7189 Other specified counseling: Secondary | ICD-10-CM

## 2024-01-11 NOTE — Patient Instructions (Addendum)
 Apply Pimecrolimus  twice daily to eyelids, forehead and chin/around mouth for 2 weeks.   Return in 2 weeks on a Monday for patch testing.   Recommend seeing ophthalmologist to R/O infection if you have a concern for infection.   Recommend using Naphcon A eye drops as directed on label.    For Hives (urticaria); dermatographism or Itch: Start non sedating antihistamine Allegra 180 mg (Fexofenadine) Start out with 1 pill a day.   After a week if not improving may increase to 2 pills a day.   After another week if not improving may increase to 3 pills a day.   After another week if still not improving may take up to 4 pills a day. Stay at highest dose that keeps condition controlled, but only up to 4 pills a day. Stay at the controlling dose for at least 2 weeks. Contact office if taking 4 pills of antihistamine a day for at least 2 weeks without control of condition as other options may be available.        Due to recent changes in healthcare laws, you may see results of your pathology and/or laboratory studies on MyChart before the doctors have had a chance to review them. We understand that in some cases there may be results that are confusing or concerning to you. Please understand that not all results are received at the same time and often the doctors may need to interpret multiple results in order to provide you with the best plan of care or course of treatment. Therefore, we ask that you please give us  2 business days to thoroughly review all your results before contacting the office for clarification. Should we see a critical lab result, you will be contacted sooner.   If You Need Anything After Your Visit  If you have any questions or concerns for your doctor, please call our main line at [phone] and press option 4 to reach your doctor's medical assistant. If no one answers, please leave a voicemail as directed and we will return your call as soon as possible. Messages left  after 4 pm will be answered the following business day.   You may also send us  a message via MyChart. We typically respond to MyChart messages within 1-2 business days.  For prescription refills, please ask your pharmacy to contact our office. Our fax number is [phone].  If you have an urgent issue when the clinic is closed that cannot wait until the next business day, you can page your doctor at the number below.    Please note that while we do our best to be available for urgent issues outside of office hours, we are not available 24/7.   If you have an urgent issue and are unable to reach us , you may choose to seek medical care at your doctor's office, retail clinic, urgent care center, or emergency room.  If you have a medical emergency, please immediately call 911 or go to the emergency department.  Pager Numbers  - Dr. Hester: [phone]  - Dr. Jackquline: [phone]  - Dr. Claudene: [phone]   In the event of inclement weather, please call our main line at [phone] for an update on the status of any delays or closures.  Dermatology Medication Tips: Please keep the boxes that topical medications come in in order to help keep track of the instructions about where and how to use these. Pharmacies typically print the medication instructions only on the boxes and not directly on the  medication tubes.   If your medication is too expensive, please contact our office at [phone] option 4 or send us  a message through MyChart.   We are unable to tell what your co-pay for medications will be in advance as this is different depending on your insurance coverage. However, we may be able to find a substitute medication at lower cost or fill out paperwork to get insurance to cover a needed medication.   If a prior authorization is required to get your medication covered by your insurance company, please allow us  1-2 business days to complete this process.  Drug prices often  vary depending on where the prescription is filled and some pharmacies may offer cheaper prices.  The website www.goodrx.com contains coupons for medications through different pharmacies. The prices here do not account for what the cost may be with help from insurance (it may be cheaper with your insurance), but the website can give you the price if you did not use any insurance.  - You can print the associated coupon and take it with your prescription to the pharmacy.  - You may also stop by our office during regular business hours and pick up a GoodRx coupon card.  - If you need your prescription sent electronically to a different pharmacy, notify our office through [facility] or by phone at [phone] option 4.     Si Usted Necesita Algo Despus de Su Visita  Tambin puede enviarnos un mensaje a travs de Clinical cytogeneticist. Por lo general respondemos a los mensajes de MyChart en el transcurso de 1 a 2 das hbiles.  Para renovar recetas, por favor pida a su farmacia que se ponga en contacto con nuestra oficina. Randi lakes de fax es [place] [phone].  Si tiene un asunto urgente cuando la clnica est cerrada y que no puede esperar hasta el siguiente da hbil, puede llamar/localizar a su doctor(a) al nmero que aparece a continuacin.   Por favor, tenga en cuenta que aunque hacemos todo lo posible para estar disponibles para asuntos urgentes fuera del horario de [place], no estamos disponibles las 24 horas del da, los 7 [address]  [address] de la [place].   Si tiene un problema urgente y no puede comunicarse con nosotros, puede optar por buscar atencin mdica  en el consultorio de su doctor(a), en una clnica privada, en un centro de atencin urgente o en una sala de emergencias.  Si tiene Engineer, drilling, por favor llame inmediatamente al 911 o vaya a la sala de emergencias.  Nmeros de bper  - Dr. Hester: [phone]  - Dra. Jackquline: [phone]  - Dr. Claudene: [phone]   En  caso de inclemencias del tiempo, por favor llame a landry capes principal al [phone] para una actualizacin sobre el [place] de cualquier retraso o cierre.  Consejos para la medicacin en dermatologa: Por favor, guarde las cajas en las que vienen los medicamentos de uso tpico para ayudarle a seguir las instrucciones sobre dnde y cmo usarlos. Las farmacias generalmente imprimen las instrucciones del medicamento slo en las cajas y no directamente en los tubos del [place].   Si su medicamento es muy caro, por favor, pngase en contacto con landry rieger llamando al [phone] y presione la opcin 4 o envenos un mensaje a travs de Clinical cytogeneticist.   No podemos decirle cul ser su copago por los medicamentos por adelantado ya que esto es diferente dependiendo de la cobertura de su seguro. Sin embargo, es posible que podamos encontrar un medicamento sustituto a  menor costo o llenar un formulario para que el seguro cubra el medicamento que se considera necesario.   Si se requiere una autorizacin previa para que su compaa de seguros malta su medicamento, por favor permtanos de 1 a 2 das hbiles para completar este proceso.  Los precios de los medicamentos varan con frecuencia dependiendo del Environmental consultant de dnde se surte la receta y alguna farmacias pueden ofrecer precios ms baratos.  El sitio web www.goodrx.com tiene cupones para medicamentos de Health and safety inspector. Los precios aqu no tienen en cuenta lo que podra costar con la ayuda del seguro (puede ser ms barato con su seguro), pero el sitio web puede darle el precio si no utiliz Tourist information centre manager.  - Puede imprimir el cupn correspondiente y llevarlo con su receta a la farmacia.  - Tambin puede pasar por nuestra oficina durante el horario de atencin regular y Education officer, museum una tarjeta de cupones de GoodRx.  - Si necesita que su receta se enve electrnicamente a una farmacia diferente, informe a nuestra oficina a travs de MyChart de [facility] o por telfono llamando al [phone] y presione la opcin 4.

## 2024-01-11 NOTE — Progress Notes (Addendum)
 Follow Up Visit   Subjective  Yesenia Moore is a 47 y.o. female who presents for the following: itchy, swollen around eyes. Rash is improving but is still very itchy. Around eyes was swollen yesterday, not as swollen today. C/O drainage/discharge at corners of eyes. Concerned may have infection in eyes. Has used Mupirocin , stopped 4 days ago. Used Pimecrolimus  cream yesterday and thinks it may have helped with itching/swelling. Taking Cephalexin  500 mg as directed, 2 weeks. Has taken Doxycycline  as well. Would like to know if she can go in the sun or does Pimecrolimus  cause sun sensitivity.  Patient states she has many questions. She would like to know what caused this rash.   Has used Mometasone , Metronidazole  previously.   C&S +staph aureus, 12/19/2023. Tx with Cefdinir by PCP. Labs from 12/19/2023 WNL per PCP.   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  The following portions of the chart were reviewed this encounter and updated as appropriate: medications, allergies, medical history  Review of Systems:  No other skin or systemic complaints except as noted in HPI or Assessment and Plan.  Objective  Well appearing patient in no apparent distress; mood and affect are within normal limits.  A focused examination was performed of the following areas: Face   Relevant exam findings are noted in the Assessment and Plan.               Assessment & Plan   ROSACEA with Perioral Dermatitis/Periorbital Dermatitis Flared recently -recommended treatment on 12/21/2023 was oral doxycycline  and topical Elidel  cream. Just prior to time primary care provider did culture showing Scant Growth Staph aureus 12/19/2023.  Because of this culture, even though was scant, doxycycline  was discontinued and topical mupirocin  and oral cephalexin  was started due to sensitivities on culture. Her facial rash for which she was initially seen in dermatology on 12/21/2023 has remarkably improved and  is almost clear.  See photos.  Patient also has eyelid dermatitis and contact dermatitis and has had Patch test positive for nickel, gold and neomycin.  Her eyelid dermatitis is thought to be secondary to mold allergy .  She has been trying to avoid gold contact.  Patient complains of continued new rashes here and there and itch.  We discussed repeat patch testing to see if she has any new allergies since her last patch test.  We will schedule for repeat patch testing soon.  She also was seen by an allergist who did testing showing positive to tree group  Today patient also complains of itchy eyeballs.  Advised to use Naphcon-A eyedrops and oral antihistamines as below.  Also advised she should see an ophthalmologist for further evaluation if itching continues.  There is no evidence of infection by exam today, but if she is concerned about this she should see an ophthalmologist.  Very complicated patient.  Exam Pale pinkness at B/L eyelids, eyebrows and glabella, erythema with peeling and erythematous papules at chin Chronic and persistent condition with duration or expected duration over one year. Condition is improving with treatment but not currently at goal. Rosacea is a chronic progressive skin condition usually affecting the face of adults, causing redness and/or acne bumps. It is treatable but not curable. It sometimes affects the eyes (ocular rosacea) as well. It may respond to topical and/or systemic medication and can flare with stress, sun exposure, alcohol, exercise, topical steroids (including hydrocortisone/cortisone 10) and some foods.  Daily application of broad spectrum spf 30+ sunscreen to face is recommended  to reduce flares.  Patient c/o itching and irritation of the eyes.  Note from allergist 07/19/2023: Yesenia Moore returns to this clinic to have skin testing performed.  She did not demonstrate any hypersensitivity against a screening panel of foods.  She did demonstrate  hypersensitivity against tree pollen on a panel of aeroallergens.  Allergen avoidance measures were provided.  I encouraged her to consistently use MetroCream  in a preventative manner at least 1 time per day and she can increase to twice a day should she develop a flare of her inflammatory dermatosis involving her face.   Treatment Plan Apply Pimecrolimus  twice daily to eyelids and chin for 2 weeks.   Return in 2 weeks on a Monday for patch testing.   Recommend seeing ophthalmologist to R/O infection if she has a concern for infection.   Recommend using Naphcon A eye drops as directed on label.   For Hives (urticaria); dermatographism or Itch: Start non sedating antihistamine Allegra 180 mg (Fexofenadine) Start out with 1 pill a day.   After a week if not improving may increase to 2 pills a day.   After another week if not improving may increase to 3 pills a day.   After another week if still not improving may take up to 4 pills a day. Stay at highest dose that keeps condition controlled, but only up to 4 pills a day. Stay at the controlling dose for at least 2 weeks. Contact office if taking 4 pills of antihistamine a day for at least 2 weeks without control of condition as other options may be available.   EYELID DERMATITIS, ALLERGIC/CONTACT, UNSPECIFIED LATERALITY   Related Procedures Patch Test RASH   Related Procedures Patch Test ROSACEA   PERIORAL DERMATITIS   COUNSELING AND COORDINATION OF CARE   MEDICATION MANAGEMENT    Patient and husband's multiple questions answered.  Extensive amount of time spent with patient and husband explaining condition and treatment recommendations in detail.   42 minutes spent in evaluation and discussion and treatment.  Return in about 2 weeks (around 01/25/2024) for Patch Testing on a Monday or Tuesday, per Dr. Hester.  I, Yesenia Moore, CMA, am acting as scribe for Alm Hester, MD.   Documentation: I have reviewed the above  documentation for accuracy and completeness, and I agree with the above.  Alm Hester, MD

## 2024-01-17 ENCOUNTER — Encounter: Payer: Self-pay | Admitting: Dermatology

## 2024-02-01 ENCOUNTER — Ambulatory Visit: Admitting: Dermatology

## 2024-02-06 ENCOUNTER — Ambulatory Visit

## 2024-02-08 ENCOUNTER — Ambulatory Visit

## 2024-02-13 ENCOUNTER — Ambulatory Visit: Admitting: Dermatology

## 2024-03-07 ENCOUNTER — Ambulatory Visit

## 2024-04-11 ENCOUNTER — Other Ambulatory Visit: Payer: Self-pay

## 2024-04-11 DIAGNOSIS — Z203 Contact with and (suspected) exposure to rabies: Secondary | ICD-10-CM | POA: Diagnosis not present

## 2024-04-11 DIAGNOSIS — Z23 Encounter for immunization: Secondary | ICD-10-CM | POA: Diagnosis not present

## 2024-04-11 DIAGNOSIS — S59912A Unspecified injury of left forearm, initial encounter: Secondary | ICD-10-CM | POA: Diagnosis present

## 2024-04-11 DIAGNOSIS — S51832A Puncture wound without foreign body of left forearm, initial encounter: Secondary | ICD-10-CM | POA: Diagnosis not present

## 2024-04-11 DIAGNOSIS — W5501XA Bitten by cat, initial encounter: Secondary | ICD-10-CM | POA: Insufficient documentation

## 2024-04-11 NOTE — ED Triage Notes (Signed)
 Pt reports she was bitten by her cat tonight on her left forearm, pt reports her cat was bitten by a wild animal yesterday but is UTD on vaccines. Small abrasion noted to arm.

## 2024-04-12 ENCOUNTER — Emergency Department
Admission: EM | Admit: 2024-04-12 | Discharge: 2024-04-12 | Disposition: A | Discharge status: Home / Self Care | Attending: Emergency Medicine | Admitting: Emergency Medicine

## 2024-04-12 DIAGNOSIS — W5501XA Bitten by cat, initial encounter: Secondary | ICD-10-CM

## 2024-04-12 MED ORDER — TETANUS-DIPHTH-ACELL PERTUSSIS 5-2-15.5 LF-MCG/0.5 IM SUSP
0.5000 mL | Freq: Once | INTRAMUSCULAR | Status: AC
Start: 2024-04-12 — End: 2024-04-12
  Administered 2024-04-12: 0.5 mL via INTRAMUSCULAR
  Filled 2024-04-12: qty 0.5

## 2024-04-12 MED ORDER — RABIES VIRUS VACCINE, HDC IM SUSR
1.0000 mL | Freq: Once | INTRAMUSCULAR | Status: AC
  Administered 2024-04-12: 1 mL via INTRAMUSCULAR
  Filled 2024-04-12: qty 1

## 2024-04-12 MED ORDER — AMOXICILLIN-POT CLAVULANATE 875-125 MG PO TABS: 1.0000 | ORAL_TABLET | Freq: Two times a day (BID) | ORAL | 0 refills | Status: AC

## 2024-04-12 NOTE — Discharge Instructions (Addendum)
 You received the first rabies vaccine today to ensure you are protected from possible rabies exposure.  This is a series of 4 vaccines. You should receive the next vaccines in 3 days, 7 days, and 14 days from now.  This can be done at [facility].

## 2024-04-12 NOTE — ED Provider Notes (Signed)
 [facility] Provider Note    Event Date/Time   First MD Initiated Contact with Patient 04/12/24 0022     (approximate)   History   Animal Bite   HPI  Yesenia Moore is a 48 y.o. female with a history of Hashimoto's thyroiditis who comes ED complaining of a cat bite at her left forearm occurring yesterday morning.  The cat is routinely laid out at night, and yesterday morning when the cat came in it seemed to be more hostile.  The patient tried brushing it, and the cat bit her arm.  It has been avoiding her and acting more reclusive.  She did note a wound on its body that appeared swollen and bloody.  Cats last rabies vaccination was 2020     Physical Exam   Triage Vital Signs: ED Triage Vitals  Encounter Vitals Group     BP 04/11/24 2229 (!) 160/85     Girls Systolic BP Percentile --      Girls Diastolic BP Percentile --      Boys Systolic BP Percentile --      Boys Diastolic BP Percentile --      Pulse Rate 04/11/24 2229 94     Resp 04/11/24 2229 18     Temp 04/11/24 2229 98.5 F (36.9 C)     Temp Source 04/11/24 2229 Oral     SpO2 04/11/24 2229 100 %     Weight 04/11/24 2228 150 lb (68 kg)     Height 04/11/24 2228 5' 3 (1.6 m)     Head Circumference --      Peak Flow --      Pain Score 04/11/24 2227 0     Pain Loc --      Pain Education --      Exclude from Growth Chart --     Most recent vital signs: Vitals:   04/11/24 2229  BP: (!) 160/85  Pulse: 94  Resp: 18  Temp: 98.5 F (36.9 C)  SpO2: 100%    General: Awake, no distress.  CV:  Good peripheral perfusion.  Regular rate rhythm, normal radial pulse Resp:  Normal effort.  Abd:  No distention.  Other:  Small punctate skin injury at the left distal forearm, not overlying a joint.  Intact range of motion in all hand extensors and flexors.  No drainage, no palpable foreign body, no tenderness   ED Results / Procedures / Treatments   Labs (all labs ordered are listed, but only  abnormal results are displayed) Labs Reviewed - No data to display   RADIOLOGY    PROCEDURES:  Procedures   MEDICATIONS ORDERED IN ED: Medications  rabies vaccine, human diploid (IMOVAX) injection 1 mL (1 mL Intramuscular Given 04/12/24 0121)  Tdap (ADACEL) injection 0.5 mL (0.5 mLs Intramuscular Given 04/12/24 0124)     IMPRESSION / MDM / ASSESSMENT AND PLAN / ED COURSE  I reviewed the triage vital signs and the nursing notes.                             Patient presents with cat bite of the left forearm.  Cat is exposed to other animals outside.  By history, it is possible that cat sustained a puncture wound and exposure from other cat several days ago and now is developing an abscess.  The bite may have been provoked due to irritation of its tender soft tissues, but I  am worried there is an increased risk of rabies so we will start rabies immunization series.  Will update tetanus.  Augmentin for cellulitis prevention/management.      FINAL CLINICAL IMPRESSION(S) / ED DIAGNOSES   Final diagnoses:  Cat bite, initial encounter     Rx / DC Orders   ED Discharge Orders          Ordered    amoxicillin -clavulanate (AUGMENTIN) 875-125 MG tablet  2 times daily        04/12/24 0049             Note:  This document was prepared using Dragon voice recognition software and may include unintentional dictation errors.   Viviann Pastor, MD 04/12/24 [phone]

## 2024-04-15 ENCOUNTER — Encounter: Payer: Self-pay | Admitting: Emergency Medicine

## 2024-04-15 ENCOUNTER — Ambulatory Visit
Admission: EM | Admit: 2024-04-15 | Discharge: 2024-04-15 | Disposition: A | Discharge status: Home / Self Care | Attending: Emergency Medicine | Admitting: Emergency Medicine

## 2024-04-15 DIAGNOSIS — Z203 Contact with and (suspected) exposure to rabies: Secondary | ICD-10-CM | POA: Diagnosis not present

## 2024-04-15 DIAGNOSIS — Z23 Encounter for immunization: Secondary | ICD-10-CM | POA: Diagnosis not present

## 2024-04-15 MED ORDER — RABIES VIRUS VACCINE, HDC IM SUSR
1.0000 mL | Freq: Once | INTRAMUSCULAR | Status: AC
  Administered 2024-04-15: 1 mL via INTRAMUSCULAR

## 2024-04-15 NOTE — ED Triage Notes (Signed)
 Patient here for day 3 of rabies vaccine. Patient denies any adverse reaction to previous vaccine.

## 2024-04-19 ENCOUNTER — Ambulatory Visit
Admission: EM | Admit: 2024-04-19 | Discharge: 2024-04-19 | Disposition: A | Discharge status: Home / Self Care | Attending: Emergency Medicine | Admitting: Emergency Medicine

## 2024-04-19 ENCOUNTER — Encounter: Payer: Self-pay | Admitting: Emergency Medicine

## 2024-04-19 DIAGNOSIS — Z203 Contact with and (suspected) exposure to rabies: Secondary | ICD-10-CM | POA: Diagnosis not present

## 2024-04-19 DIAGNOSIS — Z23 Encounter for immunization: Secondary | ICD-10-CM | POA: Diagnosis not present

## 2024-04-19 MED ORDER — RABIES VIRUS VACCINE, HDC IM SUSR
1.0000 mL | Freq: Once | INTRAMUSCULAR | Status: AC
  Administered 2024-04-19: 1 mL via INTRAMUSCULAR

## 2024-04-23 ENCOUNTER — Ambulatory Visit

## 2024-04-25 ENCOUNTER — Ambulatory Visit

## 2024-04-27 ENCOUNTER — Ambulatory Visit
Admission: EM | Admit: 2024-04-27 | Discharge: 2024-04-27 | Disposition: A | Discharge status: Home / Self Care | Attending: Emergency Medicine | Admitting: Emergency Medicine

## 2024-04-27 DIAGNOSIS — Z23 Encounter for immunization: Secondary | ICD-10-CM

## 2024-04-27 DIAGNOSIS — Z203 Contact with and (suspected) exposure to rabies: Secondary | ICD-10-CM | POA: Diagnosis not present

## 2024-04-27 MED ORDER — RABIES VIRUS VACCINE, HDC IM SUSR
1.0000 mL | Freq: Once | INTRAMUSCULAR | Status: AC
  Administered 2024-04-27: 1 mL via INTRAMUSCULAR

## 2024-04-27 NOTE — ED Triage Notes (Signed)
 Patient presents to UC for last dose of rabies vaccine. States tolerated the previous doses well.

## 2024-04-30 ENCOUNTER — Ambulatory Visit: Admitting: Dermatology
